# Patient Record
Sex: Female | Born: 1980 | Race: Black or African American | Hispanic: No | Marital: Married | Smoking: Never smoker
Health system: Southern US, Community
[De-identification: ages and names within clinical notes are randomized; demographics above are authoritative.]

## PROBLEM LIST (undated history)

## (undated) DIAGNOSIS — D649 Anemia, unspecified: Secondary | ICD-10-CM

## (undated) DIAGNOSIS — M1990 Unspecified osteoarthritis, unspecified site: Secondary | ICD-10-CM

## (undated) DIAGNOSIS — F32A Depression, unspecified: Secondary | ICD-10-CM

## (undated) DIAGNOSIS — K529 Noninfective gastroenteritis and colitis, unspecified: Secondary | ICD-10-CM

## (undated) DIAGNOSIS — F419 Anxiety disorder, unspecified: Secondary | ICD-10-CM

## (undated) DIAGNOSIS — G8929 Other chronic pain: Secondary | ICD-10-CM

## (undated) DIAGNOSIS — M549 Dorsalgia, unspecified: Secondary | ICD-10-CM

## (undated) DIAGNOSIS — R51 Headache: Secondary | ICD-10-CM

## (undated) DIAGNOSIS — F329 Major depressive disorder, single episode, unspecified: Secondary | ICD-10-CM

## (undated) HISTORY — DX: Dorsalgia, unspecified: M54.9

## (undated) HISTORY — PX: OTHER SURGICAL HISTORY: SHX169

## (undated) HISTORY — PX: COLONOSCOPY: SHX174

---
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
## 2009-03-13 ENCOUNTER — Emergency Department (HOSPITAL_COMMUNITY): Admission: EM | Admit: 2009-03-13 | Discharge: 2009-03-13 | Payer: Self-pay | Admitting: Emergency Medicine

## 2009-03-23 ENCOUNTER — Inpatient Hospital Stay (HOSPITAL_COMMUNITY): Admission: EM | Admit: 2009-03-23 | Discharge: 2009-03-27 | Payer: Self-pay | Admitting: Emergency Medicine

## 2009-03-24 ENCOUNTER — Ambulatory Visit: Payer: Self-pay | Admitting: Gastroenterology

## 2009-10-09 ENCOUNTER — Emergency Department (HOSPITAL_COMMUNITY): Admission: EM | Admit: 2009-10-09 | Discharge: 2009-10-09 | Payer: Self-pay | Admitting: Emergency Medicine

## 2010-05-10 ENCOUNTER — Emergency Department (HOSPITAL_COMMUNITY)
Admission: EM | Admit: 2010-05-10 | Discharge: 2010-05-10 | Disposition: A | Discharge status: Home / Self Care | Payer: Self-pay | Attending: Emergency Medicine | Admitting: Emergency Medicine

## 2010-05-10 ENCOUNTER — Emergency Department (HOSPITAL_COMMUNITY): Payer: Self-pay

## 2010-05-10 DIAGNOSIS — L03039 Cellulitis of unspecified toe: Secondary | ICD-10-CM | POA: Insufficient documentation

## 2010-05-10 DIAGNOSIS — G8929 Other chronic pain: Secondary | ICD-10-CM | POA: Insufficient documentation

## 2010-05-10 DIAGNOSIS — R109 Unspecified abdominal pain: Secondary | ICD-10-CM | POA: Insufficient documentation

## 2010-05-10 DIAGNOSIS — R000 Tachycardia, unspecified: Secondary | ICD-10-CM | POA: Insufficient documentation

## 2010-05-10 LAB — CBC
HCT: 39.5 % (ref 36.0–46.0)
HCT: 39.8 % (ref 36.0–46.0)
Hemoglobin: 13.4 g/dL (ref 12.0–15.0)
MCH: 30.5 pg (ref 26.0–34.0)
MCH: 28.8 pg (ref 26.0–34.0)
MCHC: 34.3 g/dL (ref 30.0–36.0)
MCHC: 33.7 g/dL (ref 30.0–36.0)
MCV: 88.8 fL (ref 78.0–100.0)
MCV: 85.4 fL (ref 78.0–100.0)
Platelets: 251 10*3/uL (ref 150–400)
Platelets: 248 10*3/uL (ref 150–400)
RBC: 4.66 MIL/uL (ref 3.87–5.11)
RDW: 14.2 % (ref 11.5–15.5)
RDW: 13.4 % (ref 11.5–15.5)
WBC: 11.4 10*3/uL — ABNORMAL HIGH (ref 4.0–10.5)
WBC: 13.1 10*3/uL — ABNORMAL HIGH (ref 4.0–10.5)

## 2010-05-10 LAB — DIFFERENTIAL
Basophils Absolute: 0.1 10*3/uL (ref 0.0–0.1)
Basophils Absolute: 0 K/uL (ref 0.0–0.1)
Basophils Relative: 1 % (ref 0–1)
Basophils Relative: 0 % (ref 0–1)
Eosinophils Absolute: 0.2 K/uL (ref 0.0–0.7)
Eosinophils Relative: 3 % (ref 0–5)
Eosinophils Relative: 1 % (ref 0–5)
Lymphocytes Relative: 22 % (ref 12–46)
Lymphs Abs: 2.8 10*3/uL (ref 0.7–4.0)
Monocytes Absolute: 1 10*3/uL (ref 0.1–1.0)
Monocytes Absolute: 1.1 10*3/uL — ABNORMAL HIGH (ref 0.1–1.0)
Monocytes Relative: 9 % (ref 3–12)
Neutro Abs: 9 10*3/uL — ABNORMAL HIGH (ref 1.7–7.7)
Neutrophils Relative %: 68 % (ref 43–77)

## 2010-05-10 LAB — COMPREHENSIVE METABOLIC PANEL WITH GFR
Albumin: 4.1 g/dL (ref 3.5–5.2)
Alkaline Phosphatase: 55 U/L (ref 39–117)
BUN: 7 mg/dL (ref 6–23)
Calcium: 8.8 mg/dL (ref 8.4–10.5)
GFR calc Af Amer: >60 mL/min (ref >60)
Potassium: 3.5 meq/L (ref 3.5–5.1)
Sodium: 136 meq/L (ref 135–145)
Total Protein: 7.2 g/dL (ref 6.0–8.3)

## 2010-05-10 LAB — URINALYSIS, ROUTINE W REFLEX MICROSCOPIC
Bilirubin Urine: NEGATIVE
Glucose, UA: NEGATIVE mg/dL
Ketones, ur: NEGATIVE mg/dL
Ketones, ur: NEGATIVE mg/dL
Leukocytes, UA: NEGATIVE
Nitrite: NEGATIVE
Protein, ur: NEGATIVE mg/dL
Protein, ur: NEGATIVE mg/dL
Specific Gravity, Urine: 1.025 (ref 1.005–1.030)
Urobilinogen, UA: 0.2 mg/dL (ref 0.0–1.0)
Urobilinogen, UA: 0.2 mg/dL (ref 0.0–1.0)
pH: 5.5 (ref 5.0–8.0)

## 2010-05-10 LAB — COMPREHENSIVE METABOLIC PANEL
ALT: 14 U/L (ref 0–35)
ALT: 17 U/L (ref 0–35)
AST: 20 U/L (ref 0–37)
Alkaline Phosphatase: 61 U/L (ref 39–117)
BUN: 11 mg/dL (ref 6–23)
CO2: 24 mEq/L (ref 19–32)
Chloride: 108 mEq/L (ref 96–112)
Creatinine, Ser: 0.93 mg/dL (ref 0.4–1.2)
Creatinine, Ser: 0.79 mg/dL (ref 0.4–1.2)
GFR calc Af Amer: >60 mL/min (ref >60)
GFR calc non Af Amer: >60 mL/min (ref >60)
GFR calc non Af Amer: >60 mL/min (ref >60)
Glucose, Bld: 86 mg/dL (ref 70–99)
Total Bilirubin: 0.1 mg/dL — ABNORMAL LOW (ref 0.3–1.2)
Total Bilirubin: 0.8 mg/dL (ref 0.3–1.2)
Total Protein: 6.8 g/dL (ref 6.0–8.3)

## 2010-05-10 LAB — URINE MICROSCOPIC-ADD ON

## 2010-05-10 LAB — LIPASE, BLOOD
Lipase: 30 U/L (ref 11–59)
Lipase: 20 U/L (ref 11–59)

## 2010-05-13 LAB — CLOSTRIDIUM DIFFICILE EIA
C difficile Toxins A+B, EIA: NEGATIVE
C difficile Toxins A+B, EIA: NEGATIVE

## 2010-05-13 LAB — DIFFERENTIAL
Basophils Relative: 1 % (ref 0–1)
Lymphocytes Relative: 18 % (ref 12–46)
Lymphs Abs: 2.5 10*3/uL (ref 0.7–4.0)
Monocytes Absolute: 0.9 10*3/uL (ref 0.1–1.0)
Monocytes Relative: 6 % (ref 3–12)
Neutro Abs: 10.5 10*3/uL — ABNORMAL HIGH (ref 1.7–7.7)
Neutrophils Relative %: 73 % (ref 43–77)

## 2010-05-13 LAB — CBC
HCT: 40.7 % (ref 36.0–46.0)
HCT: 40.8 % (ref 36.0–46.0)
Hemoglobin: 11.8 g/dL — ABNORMAL LOW (ref 12.0–15.0)
Hemoglobin: 13.3 g/dL (ref 12.0–15.0)
Hemoglobin: 13.4 g/dL (ref 12.0–15.0)
MCHC: 32.7 g/dL (ref 30.0–36.0)
MCHC: 33 g/dL (ref 30.0–36.0)
RDW: 13.6 % (ref 11.5–15.5)
RDW: 13.9 % (ref 11.5–15.5)
RDW: 14 % (ref 11.5–15.5)

## 2010-05-13 LAB — BASIC METABOLIC PANEL
BUN: 8 mg/dL (ref 6–23)
CO2: 25 mEq/L (ref 19–32)
Calcium: 8.9 mg/dL (ref 8.4–10.5)
Chloride: 106 mEq/L (ref 96–112)
Creatinine, Ser: 0.89 mg/dL (ref 0.4–1.2)
GFR calc Af Amer: >60 mL/min (ref >60)
GFR calc Af Amer: >60 mL/min (ref >60)
GFR calc non Af Amer: >60 mL/min (ref >60)
Glucose, Bld: 167 mg/dL — ABNORMAL HIGH (ref 70–99)
Glucose, Bld: 90 mg/dL (ref 70–99)
Potassium: 4.2 mEq/L (ref 3.5–5.1)
Sodium: 137 mEq/L (ref 135–145)
Sodium: 139 mEq/L (ref 135–145)

## 2010-05-13 LAB — URINALYSIS, ROUTINE W REFLEX MICROSCOPIC
Glucose, UA: NEGATIVE mg/dL
Protein, ur: NEGATIVE mg/dL
Specific Gravity, Urine: 1.026 (ref 1.005–1.030)
Urobilinogen, UA: 0.2 mg/dL (ref 0.0–1.0)

## 2010-05-13 LAB — POCT PREGNANCY, URINE: Preg Test, Ur: NEGATIVE

## 2010-05-13 LAB — COMPREHENSIVE METABOLIC PANEL
AST: 28 U/L (ref 0–37)
Albumin: 3.7 g/dL (ref 3.5–5.2)
Alkaline Phosphatase: 62 U/L (ref 39–117)
BUN: 11 mg/dL (ref 6–23)
Creatinine, Ser: 0.98 mg/dL (ref 0.4–1.2)
GFR calc non Af Amer: >60 mL/min (ref >60)
Potassium: 3.8 mEq/L (ref 3.5–5.1)
Total Bilirubin: 0.5 mg/dL (ref 0.3–1.2)

## 2010-05-13 LAB — LIPASE, BLOOD: Lipase: 20 U/L (ref 11–59)

## 2010-07-12 NOTE — Procedures (Signed)
[place]  Patient:    Crystal Hatfield                     MRN: [id number] Proc. Date: 03/13/99 Adm. Date:  [id number] Attending:  Juanita Craver CC:         Royetta Crochet. Karlton Lemon, M.D.                           Procedure Report  DATE OF BIRTH:  [birth year]  REFERRING PHYSICIAN:  Royetta Crochet. Karlton Lemon, M.D.  PROCEDURE PERFORMED:  Colonoscopy with biopsies.  ENDOSCOPIST:  Nelwyn Salisbury, M.D.  INSTRUMENT USED:  Olympus video colonoscope.  INDICATIONS FOR PROCEDURE:  Diarrhea with rectal bleeding in a 30 year old black female rule out inflammatory bowel disease.  PREPROCEDURE PREPARATION:  Informed consent was procured from the patient.  The  patient was fasted for eight hours prior to the procedure and prepped with a bottle of magnesium citrate and a gallon of NuLytely the night prior to the procedure.  PREPROCEDURE PHYSICAL:  The patient had stable vital signs.  Neck supple. Chest clear to auscultation.  S1, S2 regular.  Abdomen soft with normal abdominal bowel sounds.  DESCRIPTION OF PROCEDURE:  The patient was placed in the left lateral decubitus  position and sedated with 120 mg of Demerol and 12 mg of Versed intravenously.  Once the patient was adequately sedated and maintained on low-flow oxygen and continuous cardiac monitoring, the Olympus video colonoscope was advanced from he rectum to the cecum with slight difficulty.  The adult scope had to be withdrawn and the pediatric scope had to be used instead.  There was extreme friability of the mucosa throughout the colon with more prominent change in the left colon. Changes consistent with colitis were also seen in the cecum.  The terminal ileum appeared healthy.  The patient tolerated the procedure well.  Multiple biopsies  were done from the cecum and the colon for pathology.  Mucosal changes were also present in the rectum.  IMPRESSION:  Pancolitis.  Biopsies done,  results pending.  RECOMMENDATIONS: 1. Await pathology results. 2. Avoid all nonsteroidals for now. 3. Outpatient follow-up for further treatment recommendations. 4. Soft diet.DD:  03/13/99 TD:  03/13/99 Job: [id number]

## 2010-12-31 ENCOUNTER — Encounter: Payer: Self-pay | Admitting: Emergency Medicine

## 2010-12-31 ENCOUNTER — Emergency Department (HOSPITAL_COMMUNITY)
Admission: EM | Admit: 2010-12-31 | Discharge: 2011-01-01 | Disposition: A | Discharge status: Home / Self Care | Payer: Self-pay | Attending: Emergency Medicine | Admitting: Emergency Medicine

## 2010-12-31 DIAGNOSIS — Z8719 Personal history of other diseases of the digestive system: Secondary | ICD-10-CM | POA: Insufficient documentation

## 2010-12-31 DIAGNOSIS — R109 Unspecified abdominal pain: Secondary | ICD-10-CM | POA: Insufficient documentation

## 2010-12-31 HISTORY — DX: Noninfective gastroenteritis and colitis, unspecified: K52.9

## 2010-12-31 LAB — POCT PREGNANCY, URINE: Preg Test, Ur: NEGATIVE

## 2010-12-31 LAB — URINALYSIS, DIPSTICK ONLY
Glucose, UA: NEGATIVE mg/dL
Ketones, ur: NEGATIVE mg/dL
Leukocytes, UA: NEGATIVE
Nitrite: NEGATIVE
Specific Gravity, Urine: 1.017 (ref 1.005–1.030)
pH: 7.5 (ref 5.0–8.0)

## 2010-12-31 NOTE — ED Notes (Signed)
Lt side abd pain for a long time unsure of the time, has been seen multiple times over the past years and nothing has been found has r/o several things had ultrasound and colostomy done with no results

## 2010-12-31 NOTE — ED Notes (Signed)
Report received from [facility]

## 2011-01-01 LAB — COMPREHENSIVE METABOLIC PANEL
ALT: 18 U/L (ref 0–35)
AST: 19 U/L (ref 0–37)
Alkaline Phosphatase: 70 U/L (ref 39–117)
CO2: 24 mEq/L (ref 19–32)
Chloride: 103 mEq/L (ref 96–112)
GFR calc Af Amer: >90 mL/min (ref >90)
GFR calc non Af Amer: >90 mL/min (ref >90)
Glucose, Bld: 87 mg/dL (ref 70–99)
Potassium: 3.7 mEq/L (ref 3.5–5.1)
Sodium: 136 mEq/L (ref 135–145)
Total Bilirubin: 0.3 mg/dL (ref 0.3–1.2)

## 2011-01-01 LAB — CBC
Hemoglobin: 14 g/dL (ref 12.0–15.0)
MCH: 29.9 pg (ref 26.0–34.0)
Platelets: 281 10*3/uL (ref 150–400)
RBC: 4.68 MIL/uL (ref 3.87–5.11)
WBC: 15 10*3/uL — ABNORMAL HIGH (ref 4.0–10.5)

## 2011-01-01 MED ORDER — FAMOTIDINE 20 MG PO TABS
20.0000 mg | ORAL_TABLET | Freq: Once | ORAL | Status: AC
  Administered 2011-01-01: 20 mg via ORAL
  Filled 2011-01-01: qty 1

## 2011-01-01 MED ORDER — PANTOPRAZOLE SODIUM 40 MG PO TBEC: 40.0000 mg | DELAYED_RELEASE_TABLET | Freq: Every day | ORAL | Status: DC

## 2011-01-01 MED ORDER — DICYCLOMINE HCL 20 MG PO TABS: 20.0000 mg | ORAL_TABLET | Freq: Four times a day (QID) | ORAL | Status: DC | PRN

## 2011-01-01 MED ORDER — GI COCKTAIL ~~LOC~~
30.0000 mL | Freq: Once | ORAL | Status: AC
  Administered 2011-01-01: 30 mL via ORAL
  Filled 2011-01-01: qty 30

## 2011-01-01 MED ORDER — HYDROMORPHONE HCL PF 1 MG/ML IJ SOLN
1.0000 mg | Freq: Once | INTRAMUSCULAR | Status: AC
  Administered 2011-01-01: 1 mg via INTRAMUSCULAR
  Filled 2011-01-01: qty 1

## 2011-01-01 MED ORDER — HYDROCODONE-ACETAMINOPHEN 5-325 MG PO TABS
2.0000 | ORAL_TABLET | Freq: Once | ORAL | Status: AC
  Administered 2011-01-01: 2 via ORAL
  Filled 2011-01-01: qty 2

## 2011-01-01 NOTE — ED Notes (Signed)
Pt. Resting comfortably.

## 2011-01-01 NOTE — ED Provider Notes (Signed)
History     CSN: [id number] Arrival date & time: 12/31/2010  5:56 PM   First MD Initiated Contact with Patient 12/31/10 2354      Chief Complaint  Patient presents with  . Abdominal Pain    (Consider location/radiation/quality/duration/timing/severity/associated sxs/prior treatment) Patient is a 30 y.o. female presenting with abdominal pain. The history is provided by the patient.  Abdominal Pain The primary symptoms of the illness include abdominal pain. The primary symptoms of the illness do not include fever, shortness of breath, vomiting, diarrhea or dysuria. The problem has not changed since onset. Symptoms associated with the illness do not include back pain.  pt notes hx ulcerative colitis. States has had constant epigastric and left upper abd pain for past 1-2 years. No acute or abupt change in pain. States has had cts to eval in past, neg for acute process, u/s neg, and recent gi eval incl colonoscopy neg for acute uc flare. Saw gi doctor today - says cause constant prolonged pain unknown so sent to ed. w pain, no exacerbating or allev factors. No change w eating. Normal appetite. No wt change. No fever or chills. No prior abd surgery. Denies diarrhea or constipation. No vomiting. No cough or uri c/o. No cp. No vagina ldischarge or bleeding. No gu c/o. Denies hx pud, gallstones, pancreatitis, celiac dis, or ibs.   Past Medical History  Diagnosis Date  . Colitis     No past surgical history on file.  No family history on file.  History  Substance Use Topics  . Smoking status: Not on file  . Smokeless tobacco: Not on file  . Alcohol Use: Yes    OB History    Grav Para Term Preterm Abortions TAB SAB Ect Mult Living                  Review of Systems  Constitutional: Negative for fever.  HENT: Negative for neck pain.   Eyes: Negative for redness.  Respiratory: Negative for shortness of breath.   Cardiovascular: Negative for chest pain.  Gastrointestinal: Positive  for abdominal pain. Negative for vomiting and diarrhea.  Genitourinary: Negative for dysuria.  Musculoskeletal: Negative for back pain.  Skin: Negative for rash.  Neurological: Negative for headaches.  Hematological: Does not bruise/bleed easily.  Psychiatric/Behavioral: Negative for confusion.    Allergies  Sulfa antibiotics  Home Medications   Current Outpatient Rx  Name Route Sig Dispense Refill  . RISAQUAD PO CAPS Oral Take 1 capsule by mouth daily.      Marland Kitchen LORATADINE 10 MG PO TABS Oral Take 10 mg by mouth daily.      Marland Kitchen MESALAMINE 1.2 G PO TBEC Oral Take 1,200 mg by mouth 2 (two) times daily.      . OXYCODONE-ACETAMINOPHEN 5-325 MG PO TABS Oral Take 1 tablet by mouth every 4 (four) hours as needed. pain       BP 137/73  Pulse 98  Temp(Src) 98.3 F (36.8 C) (Oral)  Resp 20  SpO2 99%  Physical Exam  Nursing note and vitals reviewed. Constitutional: She appears well-developed and well-nourished. No distress.  Eyes: Conjunctivae are normal. No scleral icterus.  Neck: Neck supple. No tracheal deviation present.  Cardiovascular: Normal rate, regular rhythm and normal heart sounds.  Exam reveals no gallop and no friction rub.   No murmur heard. Pulmonary/Chest: Effort normal and breath sounds normal. No respiratory distress. She has no wheezes. She has no rales. She exhibits no tenderness.  Abdominal: Soft. Normal appearance  and bowel sounds are normal. She exhibits no distension and no mass. There is no tenderness. There is no rebound and no guarding.       Obese. Nt. No hernia.   Genitourinary:       No cva tendernes  Musculoskeletal: She exhibits no edema.  Neurological: She is alert.  Skin: Skin is warm and dry. No rash noted.  Psychiatric: She has a normal mood and affect.    ED Course  Procedures (including critical care time)  Labs Reviewed  URINALYSIS, DIPSTICK ONLY - Abnormal; Notable for the following:    Hgb urine dipstick TRACE (*)    All other components  within normal limits  POCT PREGNANCY, URINE  POCT PREGNANCY, URINE   No results found.   No diagnosis found.  Results for orders placed during the hospital encounter of 12/31/10  URINALYSIS, DIPSTICK ONLY      Component Value Range   Specific Gravity, Urine 1.017  1.005 - 1.030    pH 7.5  5.0 - 8.0    Glucose, UA NEGATIVE  NEGATIVE (mg/dL)   Hgb urine dipstick TRACE (*) NEGATIVE    Bilirubin Urine NEGATIVE  NEGATIVE    Ketones, ur NEGATIVE  NEGATIVE (mg/dL)   Protein, ur NEGATIVE  NEGATIVE (mg/dL)   Urobilinogen, UA 0.2  0.0 - 1.0 (mg/dL)   Nitrite NEGATIVE  NEGATIVE    Leukocytes, UA NEGATIVE  NEGATIVE   POCT PREGNANCY, URINE      Component Value Range   Preg Test, Ur NEGATIVE    CBC      Component Value Range   WBC 15.0 (*) 4.0 - 10.5 (K/uL)   RBC 4.68  3.87 - 5.11 (MIL/uL)   Hemoglobin 14.0  12.0 - 15.0 (g/dL)   HCT 41.9  36.0 - 46.0 (%)   MCV 89.5  78.0 - 100.0 (fL)   MCH 29.9  26.0 - 34.0 (pg)   MCHC 33.4  30.0 - 36.0 (g/dL)   RDW 13.2  11.5 - 15.5 (%)   Platelets 281  150 - 400 (K/uL)  COMPREHENSIVE METABOLIC PANEL      Component Value Range   Sodium 136  135 - 145 (mEq/L)   Potassium 3.7  3.5 - 5.1 (mEq/L)   Chloride 103  96 - 112 (mEq/L)   CO2 24  19 - 32 (mEq/L)   Glucose, Bld 87  70 - 99 (mg/dL)   BUN 11  6 - 23 (mg/dL)   Creatinine, Ser 0.77  0.50 - 1.10 (mg/dL)   Calcium 9.4  8.4 - 10.5 (mg/dL)   Total Protein 7.1  6.0 - 8.3 (g/dL)   Albumin 3.9  3.5 - 5.2 (g/dL)   AST 19  0 - 37 (U/L)   ALT 18  0 - 35 (U/L)   Alkaline Phosphatase 70  39 - 117 (U/L)   Total Bilirubin 0.3  0.3 - 1.2 (mg/dL)   GFR calc non Af Amer >90  >90 (mL/min)   GFR calc Af Amer >90  >90 (mL/min)  LIPASE, BLOOD      Component Value Range   Lipase 20  11 - 59 (U/L)   No results found.    MDM  Labs. Reviewed prior ct and u/s - negative. pepcid and gi cocktail po. vicodin po. Initially denies pain relief. abd soft nt. Dilaudid 1 mg im.   Pt reports pain for >1 yr without  acute change. No fever. abd soft nt. No nv. Discussed w pt close pcp and  gi follow up.   Pt had just taken course of pred.   No fever or chills. abd soft nt.     Mirna Mires, MD 01/01/11 [phone]

## 2011-01-02 ENCOUNTER — Encounter (HOSPITAL_COMMUNITY): Payer: Self-pay

## 2011-01-02 ENCOUNTER — Ambulatory Visit (HOSPITAL_COMMUNITY)
Admission: RE | Admit: 2011-01-02 | Discharge: 2011-01-02 | Disposition: A | Discharge status: Home / Self Care | Payer: Self-pay | Source: Ambulatory Visit | Attending: Gastroenterology | Admitting: Gastroenterology

## 2011-01-02 ENCOUNTER — Other Ambulatory Visit (HOSPITAL_COMMUNITY): Payer: Self-pay | Admitting: Gastroenterology

## 2011-01-02 DIAGNOSIS — R319 Hematuria, unspecified: Secondary | ICD-10-CM

## 2011-01-02 DIAGNOSIS — R109 Unspecified abdominal pain: Secondary | ICD-10-CM | POA: Insufficient documentation

## 2011-01-02 DIAGNOSIS — R1032 Left lower quadrant pain: Secondary | ICD-10-CM

## 2011-01-02 DIAGNOSIS — N83209 Unspecified ovarian cyst, unspecified side: Secondary | ICD-10-CM | POA: Insufficient documentation

## 2011-01-02 MED ORDER — IOHEXOL 300 MG/ML  SOLN
90.0000 mL | Freq: Once | INTRAMUSCULAR | Status: AC | PRN
  Administered 2011-01-02: 90 mL via INTRAVENOUS

## 2011-03-30 ENCOUNTER — Encounter (HOSPITAL_COMMUNITY): Payer: Self-pay | Admitting: Pharmacist

## 2011-04-07 ENCOUNTER — Encounter (HOSPITAL_COMMUNITY)
Admission: RE | Admit: 2011-04-07 | Discharge: 2011-04-07 | Disposition: A | Discharge status: Home / Self Care | Payer: Self-pay | Source: Ambulatory Visit | Attending: Obstetrics and Gynecology | Admitting: Obstetrics and Gynecology

## 2011-04-07 ENCOUNTER — Encounter (HOSPITAL_COMMUNITY): Payer: Self-pay

## 2011-04-07 HISTORY — DX: Dorsalgia, unspecified: M54.9

## 2011-04-07 HISTORY — DX: Anxiety disorder, unspecified: F41.9

## 2011-04-07 HISTORY — DX: Unspecified osteoarthritis, unspecified site: M19.90

## 2011-04-07 HISTORY — DX: Headache: R51

## 2011-04-07 HISTORY — DX: Anemia, unspecified: D64.9

## 2011-04-07 HISTORY — DX: Depression, unspecified: F32.A

## 2011-04-07 HISTORY — DX: Other chronic pain: G89.29

## 2011-04-07 HISTORY — DX: Major depressive disorder, single episode, unspecified: F32.9

## 2011-04-07 LAB — CBC
HCT: 40.7 % (ref 36.0–46.0)
Hemoglobin: 13.3 g/dL (ref 12.0–15.0)
MCHC: 32.7 g/dL (ref 30.0–36.0)
MCV: 88.7 fL (ref 78.0–100.0)
RDW: 13.4 % (ref 11.5–15.5)

## 2011-04-07 LAB — SURGICAL PCR SCREEN: Staphylococcus aureus: POSITIVE — AB

## 2011-04-07 NOTE — Patient Instructions (Addendum)
   Your procedure is scheduled on: Friday, Feb 15th  Enter through the [employer] [facility] at: [employer] up the phone at the desk and dial [phone] and inform us of your arrival.  Please call this number if you have any problems the morning of surgery: [phone]  Remember: Do not eat food after midnight: Thursday Do not drink clear liquids after:  Thursday Take these medicines the morning of surgery with a SIP OF WATER: Dilaudid and LIALDA  Do not wear jewelry, make-up, or FINGER nail polish Do not wear lotions, powders, perfumes or deodorant. Do not shave 48 hours prior to surgery. Do not bring valuables to the hospital.   Patients discharged on the day of surgery will not be allowed to drive home.  Home with Mother Gavina Dildine or partner Mateo Flow.   Remember to use your hibiclens as instructed.Please shower with 1/2 bottle the evening before your surgery and the other 1/2 bottle the morning of surgery.

## 2011-04-07 NOTE — Pre-Procedure Instructions (Signed)
OK to see patient DOS

## 2011-04-10 NOTE — H&P (Signed)
31 year old with LLQ pain x 1 year. She has a history of ulcerative colitis and is followed by Dr. Collene Mares.  CT Scan in November unremarkable. Pain unrelieved by birth control pills. Med History Above  Surgical history Neg  Meds See list  Allergies Sulfa  ROS Negative  Afebrile VSS Gen alert and oriented  lung CTAB Car RRR Abd sl tender in LLQ Pelvic is significant for LLQ Tendrness  Impression Chronic LLQ Pain  Plan Diagnostic laparoscopy Risks reviewed with the ptient

## 2011-04-11 ENCOUNTER — Ambulatory Visit (HOSPITAL_COMMUNITY): Payer: Self-pay | Admitting: Anesthesiology

## 2011-04-11 ENCOUNTER — Encounter (HOSPITAL_COMMUNITY): Payer: Self-pay | Admitting: Anesthesiology

## 2011-04-11 ENCOUNTER — Encounter (HOSPITAL_COMMUNITY): Payer: Self-pay | Admitting: *Deleted

## 2011-04-11 ENCOUNTER — Ambulatory Visit (HOSPITAL_COMMUNITY)
Admission: RE | Admit: 2011-04-11 | Discharge: 2011-04-11 | Disposition: A | Discharge status: Home / Self Care | Payer: Self-pay | Source: Ambulatory Visit | Attending: Obstetrics and Gynecology | Admitting: Obstetrics and Gynecology

## 2011-04-11 ENCOUNTER — Encounter (HOSPITAL_COMMUNITY)
Admission: RE | Disposition: A | Discharge status: Home / Self Care | Payer: Self-pay | Source: Ambulatory Visit | Attending: Obstetrics and Gynecology

## 2011-04-11 DIAGNOSIS — R102 Pelvic and perineal pain: Secondary | ICD-10-CM

## 2011-04-11 DIAGNOSIS — N949 Unspecified condition associated with female genital organs and menstrual cycle: Secondary | ICD-10-CM | POA: Insufficient documentation

## 2011-04-11 DIAGNOSIS — K5190 Ulcerative colitis, unspecified, without complications: Secondary | ICD-10-CM | POA: Insufficient documentation

## 2011-04-11 DIAGNOSIS — R1032 Left lower quadrant pain: Secondary | ICD-10-CM | POA: Insufficient documentation

## 2011-04-11 HISTORY — PX: LAPAROSCOPY: SHX197

## 2011-04-11 LAB — PREGNANCY, URINE: Preg Test, Ur: NEGATIVE

## 2011-04-11 SURGERY — LAPAROSCOPY OPERATIVE
Anesthesia: General | Site: Abdomen | Wound class: Clean

## 2011-04-11 MED ORDER — BUPIVACAINE HCL (PF) 0.25 % IJ SOLN
INTRAMUSCULAR | Status: AC
  Filled 2011-04-11: qty 30

## 2011-04-11 MED ORDER — HYDROMORPHONE HCL PF 1 MG/ML IJ SOLN
INTRAMUSCULAR | Status: AC
  Filled 2011-04-11: qty 1

## 2011-04-11 MED ORDER — SODIUM CHLORIDE 0.9 % IJ SOLN
INTRAMUSCULAR | Status: DC | PRN
  Administered 2011-04-11: 5 mL

## 2011-04-11 MED ORDER — GLYCOPYRROLATE 0.2 MG/ML IJ SOLN
INTRAMUSCULAR | Status: AC
  Filled 2011-04-11: qty 1

## 2011-04-11 MED ORDER — ROCURONIUM BROMIDE 100 MG/10ML IV SOLN
INTRAVENOUS | Status: DC | PRN
  Administered 2011-04-11: 5 mg via INTRAVENOUS
  Administered 2011-04-11: 35 mg via INTRAVENOUS

## 2011-04-11 MED ORDER — KETOROLAC TROMETHAMINE 30 MG/ML IJ SOLN: 15.0000 mg | Freq: Once | INTRAMUSCULAR | Status: DC | PRN

## 2011-04-11 MED ORDER — BUPIVACAINE-EPINEPHRINE (PF) 0.5% -1:200000 IJ SOLN
INTRAMUSCULAR | Status: AC
  Filled 2011-04-11: qty 10

## 2011-04-11 MED ORDER — GLYCOPYRROLATE 0.2 MG/ML IJ SOLN
INTRAMUSCULAR | Status: DC | PRN
  Administered 2011-04-11: 1 mg via INTRAVENOUS

## 2011-04-11 MED ORDER — ROCURONIUM BROMIDE 50 MG/5ML IV SOLN
INTRAVENOUS | Status: AC
  Filled 2011-04-11: qty 1

## 2011-04-11 MED ORDER — LIDOCAINE HCL (CARDIAC) 20 MG/ML IV SOLN
INTRAVENOUS | Status: AC
  Filled 2011-04-11: qty 5

## 2011-04-11 MED ORDER — BUPIVACAINE HCL (PF) 0.25 % IJ SOLN
INTRAMUSCULAR | Status: DC | PRN
  Administered 2011-04-11: 5 mL

## 2011-04-11 MED ORDER — ONDANSETRON HCL 4 MG/2ML IJ SOLN
INTRAMUSCULAR | Status: DC | PRN
  Administered 2011-04-11: 4 mg via INTRAVENOUS

## 2011-04-11 MED ORDER — LACTATED RINGERS IV SOLN
INTRAVENOUS | Status: DC
  Administered 2011-04-11: 07:00:00 via INTRAVENOUS

## 2011-04-11 MED ORDER — LIDOCAINE HCL (CARDIAC) 20 MG/ML IV SOLN
INTRAVENOUS | Status: DC | PRN
  Administered 2011-04-11: 60 mg via INTRAVENOUS

## 2011-04-11 MED ORDER — ONDANSETRON HCL 4 MG/2ML IJ SOLN
INTRAMUSCULAR | Status: AC
  Filled 2011-04-11: qty 2

## 2011-04-11 MED ORDER — FENTANYL CITRATE 0.05 MG/ML IJ SOLN
INTRAMUSCULAR | Status: DC | PRN
  Administered 2011-04-11: 100 ug via INTRAVENOUS
  Administered 2011-04-11: 150 ug via INTRAVENOUS

## 2011-04-11 MED ORDER — HYDROMORPHONE HCL 2 MG PO TABS
ORAL_TABLET | ORAL | Status: AC
  Filled 2011-04-11: qty 2

## 2011-04-11 MED ORDER — MIDAZOLAM HCL 5 MG/5ML IJ SOLN
INTRAMUSCULAR | Status: DC | PRN
  Administered 2011-04-11: 2 mg via INTRAVENOUS

## 2011-04-11 MED ORDER — DEXAMETHASONE SODIUM PHOSPHATE 10 MG/ML IJ SOLN
INTRAMUSCULAR | Status: AC
  Filled 2011-04-11: qty 1

## 2011-04-11 MED ORDER — PROPOFOL 10 MG/ML IV EMUL
INTRAVENOUS | Status: AC
  Filled 2011-04-11: qty 20

## 2011-04-11 MED ORDER — HYDROMORPHONE HCL PF 1 MG/ML IJ SOLN
INTRAMUSCULAR | Status: DC | PRN
  Administered 2011-04-11 (×2): 1 mg via INTRAVENOUS

## 2011-04-11 MED ORDER — KETOROLAC TROMETHAMINE 30 MG/ML IJ SOLN
INTRAMUSCULAR | Status: DC | PRN
  Administered 2011-04-11: 30 mg via INTRAVENOUS

## 2011-04-11 MED ORDER — DEXAMETHASONE SODIUM PHOSPHATE 4 MG/ML IJ SOLN
INTRAMUSCULAR | Status: DC | PRN
  Administered 2011-04-11: 10 mg via INTRAVENOUS

## 2011-04-11 MED ORDER — CEFAZOLIN SODIUM 1-5 GM-% IV SOLN
INTRAVENOUS | Status: AC
  Filled 2011-04-11: qty 50

## 2011-04-11 MED ORDER — HYDROMORPHONE HCL 2 MG PO TABS
4.0000 mg | ORAL_TABLET | Freq: Once | ORAL | Status: AC
  Administered 2011-04-11: 4 mg via ORAL

## 2011-04-11 MED ORDER — KETOROLAC TROMETHAMINE 60 MG/2ML IM SOLN
INTRAMUSCULAR | Status: AC
  Filled 2011-04-11: qty 2

## 2011-04-11 MED ORDER — PROPOFOL 10 MG/ML IV EMUL
INTRAVENOUS | Status: DC | PRN
  Administered 2011-04-11: 20 mg via INTRAVENOUS
  Administered 2011-04-11: 180 mg via INTRAVENOUS

## 2011-04-11 MED ORDER — FENTANYL CITRATE 0.05 MG/ML IJ SOLN
INTRAMUSCULAR | Status: AC
  Filled 2011-04-11: qty 5

## 2011-04-11 MED ORDER — CEFAZOLIN SODIUM 1-5 GM-% IV SOLN
1.0000 g | INTRAVENOUS | Status: AC
  Administered 2011-04-11: 1 g via INTRAVENOUS

## 2011-04-11 MED ORDER — KETOROLAC TROMETHAMINE 60 MG/2ML IM SOLN
INTRAMUSCULAR | Status: DC | PRN
  Administered 2011-04-11: 30 mg via INTRAMUSCULAR

## 2011-04-11 MED ORDER — FENTANYL CITRATE 0.05 MG/ML IJ SOLN
INTRAMUSCULAR | Status: AC
  Administered 2011-04-11: 50 ug via INTRAVENOUS
  Filled 2011-04-11: qty 2

## 2011-04-11 MED ORDER — MIDAZOLAM HCL 2 MG/2ML IJ SOLN
INTRAMUSCULAR | Status: AC
  Filled 2011-04-11: qty 2

## 2011-04-11 MED ORDER — NEOSTIGMINE METHYLSULFATE 1 MG/ML IJ SOLN
INTRAMUSCULAR | Status: AC
  Filled 2011-04-11: qty 10

## 2011-04-11 MED ORDER — FENTANYL CITRATE 0.05 MG/ML IJ SOLN
25.0000 ug | INTRAMUSCULAR | Status: DC | PRN
  Administered 2011-04-11 (×2): 50 ug via INTRAVENOUS

## 2011-04-11 MED ORDER — NEOSTIGMINE METHYLSULFATE 1 MG/ML IJ SOLN
INTRAMUSCULAR | Status: DC | PRN
  Administered 2011-04-11: 5 mg via INTRAVENOUS

## 2011-04-11 SURGICAL SUPPLY — 31 items
CABLE HIGH FREQUENCY MONO STRZ (ELECTRODE) IMPLANT
CATH ROBINSON RED A/P 16FR (CATHETERS) ×2 IMPLANT
CHLORAPREP W/TINT 26ML (MISCELLANEOUS) ×2 IMPLANT
CLOTH BEACON ORANGE TIMEOUT ST (SAFETY) ×2 IMPLANT
DECANTER SPIKE VIAL GLASS SM (MISCELLANEOUS) ×2 IMPLANT
DERMABOND ADVANCED (GAUZE/BANDAGES/DRESSINGS) ×1
DERMABOND ADVANCED .7 DNX12 (GAUZE/BANDAGES/DRESSINGS) ×1 IMPLANT
EVACUATOR SMOKE 8.L (FILTER) IMPLANT
GLOVE BIO SURGEON STRL SZ 6.5 (GLOVE) ×4 IMPLANT
GLOVE BIOGEL PI IND STRL 7.0 (GLOVE) ×1 IMPLANT
GLOVE BIOGEL PI INDICATOR 7.0 (GLOVE) ×1
GLOVE SS BIOGEL STRL SZ 7 (GLOVE) ×1 IMPLANT
GLOVE SUPERSENSE BIOGEL SZ 7 (GLOVE) ×1
GOWN PREVENTION PLUS LG XLONG (DISPOSABLE) ×4 IMPLANT
GOWN SURGICAL XLG (GOWNS) ×2 IMPLANT
NEEDLE INSUFFLATION 14GA 150MM (NEEDLE) ×2 IMPLANT
NS IRRIG 1000ML POUR BTL (IV SOLUTION) ×2 IMPLANT
PACK LAPAROSCOPY BASIN (CUSTOM PROCEDURE TRAY) ×2 IMPLANT
POUCH SPECIMEN RETRIEVAL 10MM (ENDOMECHANICALS) IMPLANT
PROTECTOR NERVE ULNAR (MISCELLANEOUS) ×2 IMPLANT
SEALER TISSUE G2 CVD JAW 35 (ENDOMECHANICALS) IMPLANT
SEALER TISSUE G2 CVD JAW 45CM (ENDOMECHANICALS) IMPLANT
SET IRRIG TUBING LAPAROSCOPIC (IRRIGATION / IRRIGATOR) IMPLANT
SUT VIC AB 3-0 PS2 18 (SUTURE) ×1
SUT VIC AB 3-0 PS2 18XBRD (SUTURE) ×1 IMPLANT
SUT VICRYL 0 UR6 27IN ABS (SUTURE) ×2 IMPLANT
TOWEL OR 17X24 6PK STRL BLUE (TOWEL DISPOSABLE) ×4 IMPLANT
TROCAR Z-THREAD BLADED 11X100M (TROCAR) ×2 IMPLANT
TROCAR Z-THREAD BLADED 5X100MM (TROCAR) IMPLANT
WARMER LAPAROSCOPE (MISCELLANEOUS) ×2 IMPLANT
WATER STERILE IRR 1000ML POUR (IV SOLUTION) IMPLANT

## 2011-04-11 NOTE — Brief Op Note (Signed)
04/11/2011  7:54 AM  PATIENT:  Crystal Hatfield  31 y.o. female  PRE-OPERATIVE DIAGNOSIS:  pelvic pain  POST-OPERATIVE DIAGNOSIS:  pelvic pain, adhesions involving the sigmoid colon to the pelvis  PROCEDURE:  Procedure(s) (LRB): LAPAROSCOPY OPERATIVE (N/A)  SURGEON:  Surgeon(s) and Role:    * Cyril Mourning, MD - Primary  PHYSICIAN ASSISTANT:   ASSISTANTS: none   ANESTHESIA:   general  EBL:  Total I/O In: 200 [I.V.:200] Out: -   BLOOD ADMINISTERED:none  DRAINS: none   LOCAL MEDICATIONS USED:  LIDOCAINE   SPECIMEN:  No Specimen  DISPOSITION OF SPECIMEN:  N/A  COUNTS:  YES  TOURNIQUET:  * No tourniquets in log *  DICTATION: .Other Dictation: Dictation Number [id number]  PLAN OF CARE: Discharge to home after PACU  PATIENT DISPOSITION:  PACU - hemodynamically stable.   Delay start of Pharmacological VTE agent (>24hrs) due to surgical blood loss or risk of bleeding: yes

## 2011-04-11 NOTE — Transfer of Care (Signed)
Immediate Anesthesia Transfer of Care Note  Patient: Crystal Hatfield  Procedure(s) Performed: Procedure(s) (LRB): LAPAROSCOPY OPERATIVE (N/A)  Patient Location: PACU  Anesthesia Type: General  Level of Consciousness: awake and oriented  Airway & Oxygen Therapy: Patient Spontanous Breathing and Patient connected to nasal cannula oxygen  Post-op Assessment: Report given to PACU RN and Post -op Vital signs reviewed and stable  Post vital signs: Reviewed and stable  Complications: No apparent anesthesia complications

## 2011-04-11 NOTE — Anesthesia Procedure Notes (Signed)
Procedure Name: Intubation Date/Time: 04/11/2011 7:21 AM Performed by: Kerby Nora Pre-anesthesia Checklist: Patient identified, Emergency Drugs available, Suction available, Patient being monitored and Timeout performed Patient Re-evaluated:Patient Re-evaluated prior to inductionOxygen Delivery Method: Circle System Utilized Preoxygenation: Pre-oxygenation with 100% oxygen Intubation Type: IV induction Ventilation: Mask ventilation without difficulty Laryngoscope Size: Mac and 3 Grade View: Grade I Tube type: Oral Tube size: 7.0 mm Number of attempts: 1 Airway Equipment and Method: stylet Placement Confirmation: ETT inserted through vocal cords under direct vision,  positive ETCO2 and breath sounds checked- equal and bilateral Secured at: 21 cm Tube secured with: Tape Dental Injury: Teeth and Oropharynx as per pre-operative assessment

## 2011-04-11 NOTE — Anesthesia Preprocedure Evaluation (Signed)
Anesthesia Evaluation  Patient identified by MRN, date of birth, ID band Patient awake    Reviewed: Allergy & Precautions, H&P , NPO status , Patient's Chart, lab work & pertinent test results, reviewed documented beta blocker date and time   History of Anesthesia Complications Negative for: history of anesthetic complications  Airway Mallampati: I TM Distance: >3 FB Neck ROM: full    Dental  (+) Teeth Intact   Pulmonary neg pulmonary ROS,  clear to auscultation  Pulmonary exam normal       Cardiovascular Exercise Tolerance: Good neg cardio ROS regular Normal    Neuro/Psych  Headaches, PSYCHIATRIC DISORDERS (depression/anxiety) Chronic back pain    GI/Hepatic GERD-  ,(+)     substance abuse  marijuana use, Ulcerative colitis   Endo/Other  Morbid obesity  Renal/GU negative Renal ROS  Genitourinary negative   Musculoskeletal   Abdominal   Peds  Hematology negative hematology ROS (+)   Anesthesia Other Findings   Reproductive/Obstetrics negative OB ROS                           Anesthesia Physical Anesthesia Plan  ASA: II  Anesthesia Plan: General ETT   Post-op Pain Management:    Induction:   Airway Management Planned:   Additional Equipment:   Intra-op Plan:   Post-operative Plan:   Informed Consent: I have reviewed the patients History and Physical, chart, labs and discussed the procedure including the risks, benefits and alternatives for the proposed anesthesia with the patient or authorized representative who has indicated his/her understanding and acceptance.   Dental Advisory Given  Plan Discussed with: CRNA and Surgeon  Anesthesia Plan Comments:         Anesthesia Quick Evaluation

## 2011-04-11 NOTE — Op Note (Signed)
Crystal Hatfield, Crystal Hatfield           ACCOUNT NO.:  [id number]  MEDICAL RECORD NO.:  [id number]  LOCATION:  [facility]                          FACILITY:  [facility]  PHYSICIAN:  Maiyah Goyne L. Keiasia Christianson, M.D.DATE OF BIRTH:  02/05/1981  DATE OF PROCEDURE:  04/11/2011 DATE OF DISCHARGE:  04/11/2011                              OPERATIVE REPORT   PREOPERATIVE DIAGNOSES:  Pelvic pain and ulcerative colitis.  POSTOPERATIVE DIAGNOSES:  Pelvic pain, ulcerative colitis, and adhesions involving the sigmoid colon.  SURGEON:  Olamae Ferrara L. Helane Rima, MD  ANESTHESIA:  General.  ESTIMATED BLOOD LOSS:  Minimal.  COMPLICATIONS:  None.  PROCEDURE:  The patient was taken to the operating room.  She was intubated.  She was prepped and draped in usual sterile fashion.  In-and- out catheter was used to empty the bladder.  The speculum was inserted into the vagina.  The uterine manipulator was inserted.  Attention was turned to the abdomen where a small infraumbilical incision was made. The Veress needle was inserted.  Pneumoperitoneum was performed.  An 11- mm trocar was then inserted.  The laparoscope was introduced through the trocar shape.  A 5-mm trocar was then placed suprapubically under direct visualization.  Exam of the upper abdomen revealed structures appeared normal.  Lower abdomen, uterus was normal size and configuration.  The bladder flap appeared normal.  No endometriotic lesions were noted.  The bowel surfaces appeared normal.  However, on the patient's left side which was in the area of where the patient's pain is.  There were some adhesions involving the sigmoid colon to the cul-de-sac on the left. This appeared very dense and the area did appear to be thickened.  I did not see any type of adhesion or involving endometriosis.  The ovary appeared normal.  The fallopian tube appeared normal.  The right fallopian tube and ovary appeared normal as well.  The most likely cause of this patient's pain is  secondary to her ulcerative colitis.  I think at this point, I need to refer her on.  She has a GI doctor, but I would like to send her to a colorectal surgeon to see if she possibly might need to have some type of resection of her sigmoid.  We will forward operative note as well as photographs to a colorectal surgeon.  The trocars were removed.  The incisions were closed with 3-0 Vicryl. Dermabond was applied.  The patient was extubated and went to recovery room in stable condition.     Jannelly Bergren L. Helane Rima, M.D.    Nevin Bloodgood  D:  04/11/2011  T:  04/11/2011  Job:  [id number]

## 2011-04-11 NOTE — Progress Notes (Signed)
History and Physical on the chart. No significant changes Will proceed with Diagnostic Laparoscopy  Patient already consented.

## 2011-04-11 NOTE — Anesthesia Postprocedure Evaluation (Signed)
  Anesthesia Post-op Note  Patient: Crystal Hatfield  Procedure(s) Performed: Procedure(s) (LRB): LAPAROSCOPY OPERATIVE (N/A)  Patient Location: PACU  Anesthesia Type: General  Level of Consciousness: awake, alert  and oriented  Airway and Oxygen Therapy: Patient Spontanous Breathing  Post-op Pain: moderate  Post-op Assessment: Post-op Vital signs reviewed, Patient's Cardiovascular Status Stable, Respiratory Function Stable, Patent Airway, No signs of Nausea or vomiting and Pain level controlled  Post-op Vital Signs: Reviewed and stable  Complications: No apparent anesthesia complications

## 2011-04-11 NOTE — Discharge Instructions (Signed)

## 2011-04-14 ENCOUNTER — Encounter (HOSPITAL_COMMUNITY): Payer: Self-pay | Admitting: Obstetrics and Gynecology

## 2011-11-02 ENCOUNTER — Ambulatory Visit: Payer: Self-pay | Admitting: Family Medicine

## 2011-11-02 VITALS — BP 130/77 | HR 98 | Temp 98.5°F | Resp 16 | Ht 66.5 in | Wt 212.0 lb

## 2011-11-02 DIAGNOSIS — K5190 Ulcerative colitis, unspecified, without complications: Secondary | ICD-10-CM

## 2011-11-02 DIAGNOSIS — R109 Unspecified abdominal pain: Secondary | ICD-10-CM

## 2011-11-02 DIAGNOSIS — R21 Rash and other nonspecific skin eruption: Secondary | ICD-10-CM

## 2011-11-02 MED ORDER — METHOCARBAMOL 500 MG PO TABS: 500.0000 mg | ORAL_TABLET | Freq: Three times a day (TID) | ORAL | Status: AC | PRN

## 2011-11-02 MED ORDER — NYSTATIN EX POWD: CUTANEOUS | Status: DC

## 2011-11-02 MED ORDER — OXYCODONE-ACETAMINOPHEN 5-325 MG PO TABS: 1.0000 | ORAL_TABLET | Freq: Three times a day (TID) | ORAL | Status: AC | PRN

## 2011-11-02 MED ORDER — TRAMADOL HCL 50 MG PO TABS: 50.0000 mg | ORAL_TABLET | Freq: Three times a day (TID) | ORAL | Status: AC | PRN

## 2011-11-02 NOTE — Progress Notes (Signed)
[facility]:  Office Visit  Chief Complaint:  Chief Complaint  Patient presents with  . Chest Pain    patient state she has slipped rib   . Ulcerative Colitis    HPI: Crystal Hatfield is a 31 y.o. female who complains of  Chronic left sided abd pain and rib pain, secondary to UC. She was seen by Dr. Collene Mares dx with UC, then referred to [facility] for exploratory lap by ob /gyn. Found to have adhesions. Was sent to another [facility] gastroenterologist for second opinion and was told it is not GI related except for UC. She is on mesalamine. She was on Dilaudid and MS Contin. She has dull, sharp pain that keeps her up at night.   Past Medical History  Diagnosis Date  . Colitis   . Anxiety     no meds  . Anemia     borderline per pt - no meds  . Headache     history - OTC meds prn - last one 2 weeks  . Chronic back pain   . Arthritis     knees and back, otc meds prn  . Depression     no meds   Past Surgical History  Procedure Date  . Colonoscopy     x 3  . Arthroscopic surgery     right knee  . Laparoscopy 04/11/2011    Procedure: LAPAROSCOPY OPERATIVE;  Surgeon: Cyril Mourning, MD;  Location: [place];  Service: Gynecology;  Laterality: N/A;   History   Social History  . Marital Status: Single    Spouse Name: N/A    Number of Children: N/A  . Years of Education: N/A   Social History Main Topics  . Smoking status: Never Smoker   . Smokeless tobacco: Never Used  . Alcohol Use: Yes     socially  . Drug Use: Yes    Special: Marijuana     last use today - 04/07/11  . Sexually Active: Yes    Birth Control/ Protection: None     partner   Other Topics Concern  . None   Social History Narrative  . None   Family History  Problem Relation Age of Onset  . Hypertension Mother   . Diabetes Maternal Grandmother    Allergies  Allergen Reactions  . Sulfa Antibiotics Hives    Hives.   Prior to Admission medications   Medication Sig Start Date End  Date Taking? Authorizing Provider  HYDROmorphone (DILAUDID) 2 MG tablet Take 2 mg by mouth every 4 (four) hours as needed. For pain    Historical Provider, MD  mesalamine (LIALDA) 1.2 G EC tablet Take 2,400 mg by mouth 2 (two) times daily.     Historical Provider, MD  morphine (MS CONTIN) 15 MG 12 hr tablet Take 15 mg by mouth 2 (two) times daily as needed. For pain    Historical Provider, MD     ROS: The patient denies fevers, chills, night sweats, unintentional weight loss, palpitations, wheezing, dyspnea on exertion, nausea, vomiting, dysuria, hematuria, numbness, weakness, or tingling. + abd pain + blood in stools x 2   All other systems have been reviewed and were otherwise negative with the exception of those mentioned in the HPI and as above.    PHYSICAL EXAM: Filed Vitals:   11/02/11 0810  BP: 130/77  Pulse: 98  Temp: 98.5 F (36.9 C)  Resp: 16   Filed Vitals:   11/02/11 0810  Height:  5' 6.5" (1.689 m)  Weight: 212 lb (96.163 kg)   Body mass index is 33.71 kg/(m^2).  General: Alert, no acute distress HEENT:  Normocephalic, atraumatic, oropharynx patent.  Cardiovascular:  Regular rate and rhythm, no rubs murmurs or gallops.  No Carotid bruits, radial pulse intact. No pedal edema.  Respiratory: Clear to auscultation bilaterally.  No wheezes, rales, or rhonchi.  No cyanosis, no use of accessory musculature GI: No organomegaly, abdomen is soft and mild-tender LUQ, LLQ, positive bowel sounds.  No masses. Skin: + candidiasis underneath breast Neurologic: Facial musculature symmetric. Psychiatric: Patient is appropriate throughout our interaction. Lymphatic: No cervical lymphadenopathy Musculoskeletal: Gait intact.   LABS: Results for orders placed during the hospital encounter of 04/11/11  PREGNANCY, URINE      Component Value Range   Preg Test, Ur NEGATIVE  NEGATIVE     EKG/XRAY:   Primary read interpreted by Dr. Marin Comment at [facility].   ASSESSMENT/PLAN: Encounter  Diagnoses  Name Primary?  . Abdominal pain Yes  . Ulcerative colitis    Rx: Tramadol, Robaxin and Roxicet. Patient knows that I do not prescribe MS Contin, Dilaudid at first encounter. Recommend she go back to Dr. Collene Mares or her PCP. I gave her enough medications to hold her over until her insurance kicks in in December with her new job. She denies any narcotic abuse problems.   Rx: Nystatin powder for yeast underneath breast    LE, THAO PHUONG, DO 11/02/2011 8:40 AM

## 2012-02-15 ENCOUNTER — Emergency Department (HOSPITAL_COMMUNITY)
Admission: EM | Admit: 2012-02-15 | Discharge: 2012-02-15 | Disposition: A | Discharge status: Home / Self Care | Payer: BC Managed Care – PPO | Attending: Emergency Medicine | Admitting: Emergency Medicine

## 2012-02-15 ENCOUNTER — Encounter (HOSPITAL_COMMUNITY): Payer: Self-pay | Admitting: *Deleted

## 2012-02-15 DIAGNOSIS — Z7901 Long term (current) use of anticoagulants: Secondary | ICD-10-CM | POA: Insufficient documentation

## 2012-02-15 DIAGNOSIS — Z862 Personal history of diseases of the blood and blood-forming organs and certain disorders involving the immune mechanism: Secondary | ICD-10-CM | POA: Insufficient documentation

## 2012-02-15 DIAGNOSIS — G8929 Other chronic pain: Secondary | ICD-10-CM | POA: Insufficient documentation

## 2012-02-15 DIAGNOSIS — R109 Unspecified abdominal pain: Secondary | ICD-10-CM | POA: Insufficient documentation

## 2012-02-15 DIAGNOSIS — Z792 Long term (current) use of antibiotics: Secondary | ICD-10-CM | POA: Insufficient documentation

## 2012-02-15 DIAGNOSIS — K5190 Ulcerative colitis, unspecified, without complications: Secondary | ICD-10-CM | POA: Insufficient documentation

## 2012-02-15 DIAGNOSIS — T7840XA Allergy, unspecified, initial encounter: Secondary | ICD-10-CM

## 2012-02-15 DIAGNOSIS — M549 Dorsalgia, unspecified: Secondary | ICD-10-CM | POA: Insufficient documentation

## 2012-02-15 DIAGNOSIS — IMO0002 Reserved for concepts with insufficient information to code with codable children: Secondary | ICD-10-CM | POA: Insufficient documentation

## 2012-02-15 DIAGNOSIS — Z8659 Personal history of other mental and behavioral disorders: Secondary | ICD-10-CM | POA: Insufficient documentation

## 2012-02-15 DIAGNOSIS — T368X5A Adverse effect of other systemic antibiotics, initial encounter: Secondary | ICD-10-CM | POA: Insufficient documentation

## 2012-02-15 DIAGNOSIS — Z3202 Encounter for pregnancy test, result negative: Secondary | ICD-10-CM | POA: Insufficient documentation

## 2012-02-15 DIAGNOSIS — Z8739 Personal history of other diseases of the musculoskeletal system and connective tissue: Secondary | ICD-10-CM | POA: Insufficient documentation

## 2012-02-15 DIAGNOSIS — R1310 Dysphagia, unspecified: Secondary | ICD-10-CM | POA: Insufficient documentation

## 2012-02-15 DIAGNOSIS — Z888 Allergy status to other drugs, medicaments and biological substances status: Secondary | ICD-10-CM | POA: Insufficient documentation

## 2012-02-15 LAB — URINALYSIS, ROUTINE W REFLEX MICROSCOPIC
Protein, ur: NEGATIVE mg/dL
Specific Gravity, Urine: 1.02 (ref 1.005–1.030)
Urobilinogen, UA: 0.2 mg/dL (ref 0.0–1.0)

## 2012-02-15 LAB — POCT PREGNANCY, URINE: Preg Test, Ur: NEGATIVE

## 2012-02-15 LAB — URINE MICROSCOPIC-ADD ON

## 2012-02-15 MED ORDER — HYDROMORPHONE HCL PF 1 MG/ML IJ SOLN
1.0000 mg | Freq: Once | INTRAMUSCULAR | Status: AC
  Administered 2012-02-15: 1 mg via INTRAVENOUS
  Filled 2012-02-15: qty 1

## 2012-02-15 MED ORDER — METHYLPREDNISOLONE SODIUM SUCC 125 MG IJ SOLR
125.0000 mg | Freq: Once | INTRAMUSCULAR | Status: AC
  Administered 2012-02-15: 125 mg via INTRAVENOUS
  Filled 2012-02-15: qty 2

## 2012-02-15 MED ORDER — DIPHENHYDRAMINE HCL 50 MG/ML IJ SOLN
50.0000 mg | Freq: Once | INTRAMUSCULAR | Status: AC
  Administered 2012-02-15: 50 mg via INTRAVENOUS
  Filled 2012-02-15: qty 1

## 2012-02-15 MED ORDER — FAMOTIDINE IN NACL 20-0.9 MG/50ML-% IV SOLN
20.0000 mg | Freq: Once | INTRAVENOUS | Status: AC
  Administered 2012-02-15: 20 mg via INTRAVENOUS
  Filled 2012-02-15: qty 50

## 2012-02-15 NOTE — ED Notes (Signed)
Pt reports allergic reaction/ rash x1 day. Hx of ulcerative colitis - currently has flare up. MD prescribed Cipro for blood in urine, prednisone, Hydromorphone. Presents with pain in stomach which is worse today, redness to skin which is better today. Took benadryl at 10pm last night. Reports difficulty swalloing, feels congested, denies SOB.

## 2012-02-15 NOTE — ED Provider Notes (Signed)
History     CSN: [id number]  Arrival date & time 02/15/12  6644   First MD Initiated Contact with Patient 02/15/12 0935      No chief complaint on file.   (Consider location/radiation/quality/duration/timing/severity/associated sxs/prior treatment) HPI Patient developed pruritic rash diffusely possibly 5 PM yesterday one hour after taking Cipro for presumed urinary tract infection. She denies shortness of breath denies pain on swallowing. Admits to mild difficulty swallowing denies chest pain. Admits to left-sided abdominal pain which is chronic for several years and unchanged 22 ulcerative colitis. She treated herself with Benadryl 10 PM last night with transient partial relief. No other associated symptoms rash is diffuse and pruritic Past Medical History  Diagnosis Date  . Colitis   . Anxiety     no meds  . Anemia     borderline per pt - no meds  . Headache     history - OTC meds prn - last one 2 weeks  . Chronic back pain   . Arthritis     knees and back, otc meds prn  . Depression     no meds    Past Surgical History  Procedure Date  . Colonoscopy     x 3  . Arthroscopic surgery     right knee  . Laparoscopy 04/11/2011    Procedure: LAPAROSCOPY OPERATIVE;  Surgeon: Cyril Mourning, MD;  Location: [facility];  Service: Gynecology;  Laterality: N/A;    Family History  Problem Relation Age of Onset  . Hypertension Mother   . Diabetes Maternal Grandmother     History  Substance Use Topics  . Smoking status: Never Smoker   . Smokeless tobacco: Never Used  . Alcohol Use: Yes     Comment: socially    OB History    Grav Para Term Preterm Abortions TAB SAB Ect Mult Living                  Review of Systems  HENT: Positive for trouble swallowing.   Skin: Positive for rash.    Allergies  Sulfa antibiotics  Home Medications   Current Outpatient Rx  Name  Route  Sig  Dispense  Refill  . HYDROMORPHONE HCL 2 MG PO TABS   Oral   Take 2 mg by mouth every 4  (four) hours as needed. For pain         . MESALAMINE 1.2 G PO TBEC   Oral   Take 2,400 mg by mouth 2 (two) times daily.          . MORPHINE SULFATE ER 15 MG PO TB12   Oral   Take 15 mg by mouth 2 (two) times daily as needed. For pain         . NYSTATIN EX POWD      Apply to affected area BID prn   1 Bottle   2     BP 109/91  Pulse 106  Temp 99 F (37.2 C) (Oral)  Resp 15  SpO2 96%  LMP 02/04/2012  Physical Exam  Nursing note and vitals reviewed. Constitutional: She appears well-developed and well-nourished. No distress.       Haloing secretions well no distress. Speaks in paragraphs  HENT:  Head: Normocephalic and atraumatic.  Mouth/Throat: Oropharyngeal exudate present.       Oropharynx normal, no hoarseness  Eyes: Conjunctivae normal are normal. Pupils are equal, round, and reactive to light.  Neck: Neck supple. No tracheal deviation present. No thyromegaly present.  Cardiovascular: Normal rate and regular rhythm.   No murmur heard. Pulmonary/Chest: Effort normal and breath sounds normal.  Abdominal: Soft. Bowel sounds are normal. She exhibits no distension. There is no tenderness.  Musculoskeletal: Normal range of motion. She exhibits no edema and no tenderness.  Neurological: She is alert. Coordination normal.  Skin: Skin is warm and dry. No rash noted. No erythema.       Diffusely erythematous face trunk and extremities  Psychiatric: She has a normal mood and affect.    ED Course  Procedures (including critical care time)   Labs Reviewed  URINALYSIS, ROUTINE W REFLEX MICROSCOPIC   No results found.   No diagnosis found.   10:15 AM reports that itching has stopped she complains of diffuse abdominal pain which is typical of her ulcerative colitis. Hydromorphone ordered. I exam skin remains reddened, no respiratory distress and abdomen is minimally diffusely tender   1:50 PM feels improved after treatment with hydromorphone. Rash is almost gone.  No longer itching.  Results for orders placed during the hospital encounter of 02/15/12  URINALYSIS, ROUTINE W REFLEX MICROSCOPIC      Component Value Range   Color, Urine YELLOW  YELLOW   APPearance CLOUDY (*) CLEAR   Specific Gravity, Urine 1.020  1.005 - 1.030   pH 6.0  5.0 - 8.0   Glucose, UA NEGATIVE  NEGATIVE mg/dL   Hgb urine dipstick MODERATE (*) NEGATIVE   Bilirubin Urine NEGATIVE  NEGATIVE   Ketones, ur NEGATIVE  NEGATIVE mg/dL   Protein, ur NEGATIVE  NEGATIVE mg/dL   Urobilinogen, UA 0.2  0.0 - 1.0 mg/dL   Nitrite NEGATIVE  NEGATIVE   Leukocytes, UA SMALL (*) NEGATIVE  POCT PREGNANCY, URINE      Component Value Range   Preg Test, Ur NEGATIVE  NEGATIVE  URINE MICROSCOPIC-ADD ON      Component Value Range   Squamous Epithelial / LPF FEW (*) RARE   WBC, UA 0-2  <3 WBC/hpf   RBC / HPF 0-2  <3 RBC/hpf   Bacteria, UA FEW (*) RARE   Casts HYALINE CASTS (*) NEGATIVE   No results found.  MDM  Plan patient is currently on the first of a prednisone taper prescribed by Dr.Mann. She can continue prednisone taper. Stop Cipro. Benadryl 50 mg every 6 hours as needed for itch Diagnosis #1 allergic reaction to Cipro #2 chronic abdominal pain        Orlie Dakin, MD 02/15/12 1359

## 2012-02-15 NOTE — ED Notes (Signed)
Pt mother Ennis Forts # [phone]

## 2012-02-24 ENCOUNTER — Emergency Department (HOSPITAL_COMMUNITY)
Admission: EM | Admit: 2012-02-24 | Discharge: 2012-02-24 | Disposition: A | Discharge status: Home / Self Care | Payer: BC Managed Care – PPO | Attending: Emergency Medicine | Admitting: Emergency Medicine

## 2012-02-24 ENCOUNTER — Emergency Department (HOSPITAL_COMMUNITY): Payer: BC Managed Care – PPO

## 2012-02-24 DIAGNOSIS — S79919A Unspecified injury of unspecified hip, initial encounter: Secondary | ICD-10-CM | POA: Insufficient documentation

## 2012-02-24 DIAGNOSIS — Z862 Personal history of diseases of the blood and blood-forming organs and certain disorders involving the immune mechanism: Secondary | ICD-10-CM | POA: Insufficient documentation

## 2012-02-24 DIAGNOSIS — Y929 Unspecified place or not applicable: Secondary | ICD-10-CM | POA: Insufficient documentation

## 2012-02-24 DIAGNOSIS — Z8659 Personal history of other mental and behavioral disorders: Secondary | ICD-10-CM | POA: Insufficient documentation

## 2012-02-24 DIAGNOSIS — G8929 Other chronic pain: Secondary | ICD-10-CM | POA: Insufficient documentation

## 2012-02-24 DIAGNOSIS — Z79899 Other long term (current) drug therapy: Secondary | ICD-10-CM | POA: Insufficient documentation

## 2012-02-24 DIAGNOSIS — K5289 Other specified noninfective gastroenteritis and colitis: Secondary | ICD-10-CM | POA: Insufficient documentation

## 2012-02-24 DIAGNOSIS — R296 Repeated falls: Secondary | ICD-10-CM | POA: Insufficient documentation

## 2012-02-24 DIAGNOSIS — Y9301 Activity, walking, marching and hiking: Secondary | ICD-10-CM | POA: Insufficient documentation

## 2012-02-24 DIAGNOSIS — M5430 Sciatica, unspecified side: Secondary | ICD-10-CM

## 2012-02-24 DIAGNOSIS — S79929A Unspecified injury of unspecified thigh, initial encounter: Secondary | ICD-10-CM | POA: Insufficient documentation

## 2012-02-24 DIAGNOSIS — M129 Arthropathy, unspecified: Secondary | ICD-10-CM | POA: Insufficient documentation

## 2012-02-24 MED ORDER — KETOROLAC TROMETHAMINE 30 MG/ML IJ SOLN
30.0000 mg | Freq: Once | INTRAMUSCULAR | Status: DC
  Filled 2012-02-24: qty 1

## 2012-02-24 MED ORDER — HYDROCODONE-ACETAMINOPHEN 5-325 MG PO TABS
2.0000 | ORAL_TABLET | Freq: Once | ORAL | Status: AC
  Administered 2012-02-24: 2 via ORAL
  Filled 2012-02-24: qty 1

## 2012-02-24 MED ORDER — HYDROMORPHONE HCL 4 MG PO TABS: ORAL_TABLET | ORAL | Status: DC

## 2012-02-24 MED ORDER — HYDROMORPHONE HCL PF 2 MG/ML IJ SOLN
2.0000 mg | Freq: Once | INTRAMUSCULAR | Status: AC
  Administered 2012-02-24: 2 mg via INTRAMUSCULAR
  Filled 2012-02-24: qty 1

## 2012-02-24 NOTE — ED Notes (Signed)
[id number]> Expected date:<BR> Expected time:<BR> Means of arrival:<BR> Comments:<BR> ems

## 2012-02-24 NOTE — ED Provider Notes (Signed)
History     CSN: [id number]  Arrival date & time 02/24/12  0910   First MD Initiated Contact with Patient 02/24/12 [phone]      Chief Complaint  Patient presents with  . Fall  . Flank Pain  . Hip Pain     HPI Per EMS: Pt fell on Sunday while walking her dog. Now c/o of right flank, hip and thigh pain (9/10). Tried taking hydromorphone with no relief  Past Medical History  Diagnosis Date  . Colitis   . Anxiety     no meds  . Anemia     borderline per pt - no meds  . Headache     history - OTC meds prn - last one 2 weeks  . Chronic back pain   . Arthritis     knees and back, otc meds prn  . Depression     no meds    Past Surgical History  Procedure Date  . Colonoscopy     x 3  . Arthroscopic surgery     right knee  . Laparoscopy 04/11/2011    Procedure: LAPAROSCOPY OPERATIVE;  Surgeon: Cyril Mourning, MD;  Location: [facility];  Service: Gynecology;  Laterality: N/A;    Family History  Problem Relation Age of Onset  . Hypertension Mother   . Diabetes Maternal Grandmother     History  Substance Use Topics  . Smoking status: Never Smoker   . Smokeless tobacco: Never Used  . Alcohol Use: Yes     Comment: socially    OB History    Grav Para Term Preterm Abortions TAB SAB Ect Mult Living                  Review of Systems All other systems reviewed and are negative Allergies  Ciprofloxacin and Sulfa antibiotics  Home Medications   Current Outpatient Rx  Name  Route  Sig  Dispense  Refill  . MESALAMINE 1.2 G PO TBEC   Oral   Take 3,600 mg by mouth 2 (two) times daily.          Marland Kitchen HYDROMORPHONE HCL 4 MG PO TABS      For pain.  Take 1 tablet every 8 hours as needed   30 tablet   0   . PREDNISONE 10 MG PO TABS   Oral   Take 5-40 mg by mouth daily. Taper down 1 tab each week, until 1 tab daily, then taper down to 0.5 tab daily for one last week.  Started 02/14/12           BP 107/62  Pulse 76  Temp 98.8 F (37.1 C) (Oral)  Resp 16   SpO2 100%  LMP 02/04/2012  Physical Exam  Nursing note and vitals reviewed. Constitutional: She is oriented to person, place, and time. She appears well-developed and well-nourished. No distress.  HENT:  Head: Normocephalic and atraumatic.  Eyes: Pupils are equal, round, and reactive to light.  Neck: Normal range of motion.  Cardiovascular: Normal rate and intact distal pulses.   Pulmonary/Chest: No respiratory distress.  Abdominal: Normal appearance. She exhibits no distension.  Musculoskeletal:       Back:       Pain with radiation is indicated  Neurological: She is alert and oriented to person, place, and time. No cranial nerve deficit.  Skin: Skin is warm and dry. No rash noted.  Psychiatric: She has a normal mood and affect. Her behavior is normal.  ED Course  Procedures (including critical care time)  Labs Reviewed - No data to display Dg Lumbar Spine Complete  02/24/2012  *RADIOLOGY REPORT*  Clinical Data: [place].  Back pain.  LUMBAR SPINE - COMPLETE 4+ VIEW  Comparison: None  Findings: The lateral film demonstrates normal alignment. Vertebral bodies and disc spaces are maintained.  No acute bony findings.  Normal alignment of the facet joints and no pars defects.  The visualized bony pelvis in intact.  IMPRESSION: Normal alignment and no acute bony findings.   Original Report Authenticated By: Marijo Sanes, M.D.    Dg Pelvis 1-2 Views  02/24/2012  *RADIOLOGY REPORT*  Clinical Data: [place].  Pelvic pain.  PELVIS - 1-2 VIEW  Comparison: None  Findings: The hips are normally located.  No acute hip fracture or plain film evidence for avascular necrosis.  The pubic symphysis and SI joints are intact.  No pelvic fractures.  IMPRESSION: No acute bony findings.   Original Report Authenticated By: Marijo Sanes, M.D.      1. Sciatica       MDM         Dot Lanes, MD 02/25/12 [phone]

## 2012-02-24 NOTE — ED Notes (Signed)
Per EMS: Pt fell on Sunday while walking her dog. Now c/o of right flank, hip and thigh pain (9/10). Tried taking hydromorphone with no relief

## 2012-03-07 ENCOUNTER — Emergency Department (HOSPITAL_COMMUNITY)
Admission: EM | Admit: 2012-03-07 | Discharge: 2012-03-08 | Disposition: A | Discharge status: Home / Self Care | Payer: BC Managed Care – PPO | Attending: Emergency Medicine | Admitting: Emergency Medicine

## 2012-03-07 ENCOUNTER — Encounter (HOSPITAL_COMMUNITY): Payer: Self-pay | Admitting: *Deleted

## 2012-03-07 DIAGNOSIS — Z791 Long term (current) use of non-steroidal anti-inflammatories (NSAID): Secondary | ICD-10-CM | POA: Insufficient documentation

## 2012-03-07 DIAGNOSIS — IMO0002 Reserved for concepts with insufficient information to code with codable children: Secondary | ICD-10-CM | POA: Insufficient documentation

## 2012-03-07 DIAGNOSIS — Z79899 Other long term (current) drug therapy: Secondary | ICD-10-CM | POA: Insufficient documentation

## 2012-03-07 DIAGNOSIS — Z8679 Personal history of other diseases of the circulatory system: Secondary | ICD-10-CM | POA: Insufficient documentation

## 2012-03-07 DIAGNOSIS — Z8659 Personal history of other mental and behavioral disorders: Secondary | ICD-10-CM | POA: Insufficient documentation

## 2012-03-07 DIAGNOSIS — G8929 Other chronic pain: Secondary | ICD-10-CM | POA: Insufficient documentation

## 2012-03-07 DIAGNOSIS — K5289 Other specified noninfective gastroenteritis and colitis: Secondary | ICD-10-CM | POA: Insufficient documentation

## 2012-03-07 DIAGNOSIS — L0390 Cellulitis, unspecified: Secondary | ICD-10-CM

## 2012-03-07 DIAGNOSIS — Z862 Personal history of diseases of the blood and blood-forming organs and certain disorders involving the immune mechanism: Secondary | ICD-10-CM | POA: Insufficient documentation

## 2012-03-07 DIAGNOSIS — M129 Arthropathy, unspecified: Secondary | ICD-10-CM | POA: Insufficient documentation

## 2012-03-07 NOTE — ED Notes (Signed)
Pt states in December developed small amount swelling redness right elbow; saw pcp at that time and had labwork and told all labs were ok; takes Prednisone for ulcerative colitis; had an allergic reaction to cipro in December; has continued to have increased swelling/pain/redness/warmth to right elbow;

## 2012-03-07 NOTE — ED Notes (Addendum)
Patient has baseball sized reddened and swollen area to right forearm, near elbow. Patient states it started as a small sore spot. Patient states seeing her PCP in December, lab work was completed at that time, and patient states she was told everything was normal. Patient reports she has had two ER visits, one for allergic reaction and one for sciatic back pain in the last month. Patient comes in tonight due to increased area of redness, swelling, and pain. Patient taking 84m PO Dilaudid for other pain issues, reports last dose at @1730 .

## 2012-03-08 MED ORDER — CEPHALEXIN 500 MG PO CAPS: 500.0000 mg | ORAL_CAPSULE | Freq: Four times a day (QID) | ORAL | Status: DC

## 2012-03-08 NOTE — ED Provider Notes (Signed)
History     CSN: [id number]  Arrival date & time 03/07/12  2303   First MD Initiated Contact with Patient 03/07/12 2339      Chief Complaint  Patient presents with  . R arm pain     (Consider location/radiation/quality/duration/timing/severity/associated sxs/prior treatment) The history is provided by the patient. No language interpreter was used.  32yo female with small patch of cellulitis to R forearm.  No elbow/joint  Pain.  No fever n/v.   Past Medical History  Diagnosis Date  . Colitis   . Anxiety     no meds  . Anemia     borderline per pt - no meds  . Headache     history - OTC meds prn - last one 2 weeks  . Chronic back pain   . Arthritis     knees and back, otc meds prn  . Depression     no meds    Past Surgical History  Procedure Date  . Colonoscopy     x 3  . Arthroscopic surgery     right knee  . Laparoscopy 04/11/2011    Procedure: LAPAROSCOPY OPERATIVE;  Surgeon: Cyril Mourning, MD;  Location: [facility];  Service: Gynecology;  Laterality: N/A;    Family History  Problem Relation Age of Onset  . Hypertension Mother   . Diabetes Maternal Grandmother     History  Substance Use Topics  . Smoking status: Never Smoker   . Smokeless tobacco: Never Used  . Alcohol Use: Yes     Comment: socially    OB History    Grav Para Term Preterm Abortions TAB SAB Ect Mult Living                  Review of Systems  Constitutional: Negative.   HENT: Negative.   Eyes: Negative.   Respiratory: Negative.   Cardiovascular: Negative.   Gastrointestinal: Negative.   Skin:       tendernes to posterior R forearm  Neurological: Negative.   Psychiatric/Behavioral: Negative.   All other systems reviewed and are negative.    Allergies  Ciprofloxacin and Sulfa antibiotics  Home Medications   Current Outpatient Rx  Name  Route  Sig  Dispense  Refill  . FISH OIL-FLAX OIL-BORAGE OIL PO CAPS   Oral   Take 2 capsules by mouth every morning.         Marland Kitchen  HYDROMORPHONE HCL 4 MG PO TABS      4 mg every 8 (eight) hours as needed. For pain.  Take 1 tablet every 8 hours as needed         . MESALAMINE 1.2 G PO TBEC   Oral   Take 3,600 mg by mouth 2 (two) times daily.          Marland Kitchen OVER THE COUNTER MEDICATION   Oral   Take 1 capsule by mouth every morning. L-carnitine 500 mg with raspberry ketones 100 mg         . OVER THE COUNTER MEDICATION   Oral   Take 2 capsules by mouth every morning. Green coffee ,garcinia, green tea supplement         . PREDNISONE 10 MG PO TABS   Oral   Take 5-40 mg by mouth daily. Taper down 1 tablet less each week.  Start with 4 tablets for 7days ,then 3 tablets for 7 days, then 2 tablets for 7 days, then 1 tablet for 7 days then 1/2 tablet for  7 days.  Started 02/14/12         . CEPHALEXIN 500 MG PO CAPS   Oral   Take 1 capsule (500 mg total) by mouth 4 (four) times daily.   20 capsule   0     BP 120/77  Pulse 78  Temp 98.3 F (36.8 C)  Resp 20  SpO2 98%  LMP 02/29/2012  Physical Exam  Nursing note and vitals reviewed. Constitutional: She is oriented to person, place, and time. She appears well-developed and well-nourished.  HENT:  Head: Normocephalic and atraumatic.  Eyes: Conjunctivae normal and EOM are normal. Pupils are equal, round, and reactive to light.  Neck: Normal range of motion. Neck supple.  Cardiovascular: Normal rate.   Pulmonary/Chest: Effort normal.  Abdominal: Soft.  Musculoskeletal: Normal range of motion. She exhibits no edema and no tenderness.  Neurological: She is alert and oriented to person, place, and time. She has normal reflexes.  Skin: Skin is warm and dry.       6cm area of cellulitistenderness without fluctuance to R forearm  Psychiatric: She has a normal mood and affect.    ED Course  Procedures (including critical care time)  Labs Reviewed - No data to display No results found.   1. Cellulitis       MDM  Cellulitis without fever, n/v to R  forearm.  Area marked and she will return in 2 days for recheck.  rx for cephalexin.  Non toxic appearance.  She understands to return sooner for worsening symptoms.  No joint involvement.  Allergic to sulfa.         Julieta Bellini, NP 03/08/12 1426

## 2012-03-08 NOTE — ED Notes (Signed)
Discharge instructions reviewed. Rx given x1. All questions answered.

## 2012-03-08 NOTE — Discharge Instructions (Signed)
Ms Cotta the infection on your arm is called cellulitis.  Start the antibiotic as soon as possible.  Recheck in 2 days here or your pcp.   Return sooner for high fever, nausea, vomiting, increasing area of redness.

## 2012-03-10 NOTE — ED Provider Notes (Signed)
Medical screening examination/treatment/procedure(s) were performed by non-physician practitioner and as supervising physician I was immediately available for consultation/collaboration.   Wynetta Fines, MD 03/10/12 2250

## 2012-04-15 ENCOUNTER — Encounter (HOSPITAL_COMMUNITY): Payer: Self-pay | Admitting: Emergency Medicine

## 2012-04-15 ENCOUNTER — Other Ambulatory Visit: Payer: Self-pay

## 2012-04-15 ENCOUNTER — Emergency Department (HOSPITAL_COMMUNITY)
Admission: EM | Admit: 2012-04-15 | Discharge: 2012-04-15 | Disposition: A | Discharge status: Home / Self Care | Payer: BC Managed Care – PPO | Attending: Emergency Medicine | Admitting: Emergency Medicine

## 2012-04-15 ENCOUNTER — Emergency Department (HOSPITAL_COMMUNITY): Payer: BC Managed Care – PPO

## 2012-04-15 DIAGNOSIS — F411 Generalized anxiety disorder: Secondary | ICD-10-CM | POA: Insufficient documentation

## 2012-04-15 DIAGNOSIS — Z8659 Personal history of other mental and behavioral disorders: Secondary | ICD-10-CM | POA: Insufficient documentation

## 2012-04-15 DIAGNOSIS — R079 Chest pain, unspecified: Secondary | ICD-10-CM

## 2012-04-15 DIAGNOSIS — Z862 Personal history of diseases of the blood and blood-forming organs and certain disorders involving the immune mechanism: Secondary | ICD-10-CM | POA: Insufficient documentation

## 2012-04-15 DIAGNOSIS — M549 Dorsalgia, unspecified: Secondary | ICD-10-CM | POA: Insufficient documentation

## 2012-04-15 DIAGNOSIS — R071 Chest pain on breathing: Secondary | ICD-10-CM | POA: Insufficient documentation

## 2012-04-15 DIAGNOSIS — Z8739 Personal history of other diseases of the musculoskeletal system and connective tissue: Secondary | ICD-10-CM | POA: Insufficient documentation

## 2012-04-15 DIAGNOSIS — Z79899 Other long term (current) drug therapy: Secondary | ICD-10-CM | POA: Insufficient documentation

## 2012-04-15 DIAGNOSIS — G8929 Other chronic pain: Secondary | ICD-10-CM | POA: Insufficient documentation

## 2012-04-15 MED ORDER — IBUPROFEN 600 MG PO TABS: 600.0000 mg | ORAL_TABLET | Freq: Three times a day (TID) | ORAL | Status: DC | PRN

## 2012-04-15 MED ORDER — HYDROCODONE-ACETAMINOPHEN 5-325 MG PO TABS: 1.0000 | ORAL_TABLET | ORAL | Status: DC | PRN

## 2012-04-15 MED ORDER — IBUPROFEN 200 MG PO TABS
600.0000 mg | ORAL_TABLET | Freq: Once | ORAL | Status: AC
  Administered 2012-04-15: 600 mg via ORAL
  Filled 2012-04-15: qty 3

## 2012-04-15 MED ORDER — OXYCODONE-ACETAMINOPHEN 5-325 MG PO TABS
1.0000 | ORAL_TABLET | Freq: Once | ORAL | Status: AC
  Administered 2012-04-15: 1 via ORAL
  Filled 2012-04-15: qty 1

## 2012-04-15 NOTE — ED Provider Notes (Signed)
History     CSN: [id number]  Arrival date & time 04/15/12  1009   First MD Initiated Contact with Patient 04/15/12 1030      Chief Complaint  Patient presents with  . Chest Pain   The history is provided by the patient.   patient reports 3 days of left-sided chest pain that is worse with movement of her left arm and deep breathing or coughing.  No recent falls or trauma.  No history of DVT or pulmonary embolism.  No recent surgery or long travel.  She does not take birth control nor does she smoke cigarettes.  No family history of clots.  She used to be on Dilaudid for chronic back pain and is currently between pain physicians.  She states that she has tried Tylenol at home without improvement in her symptoms.  She tries to avoid ibuprofen given her history of.  Colitis.  Her symptoms are mild to moderate in severity.  Nothing improves her pain.  Her pain is worsened as above.  No cough or congestion.  No shortness of breath.  No unilateral leg swelling the  Past Medical History  Diagnosis Date  . Colitis   . Anxiety     no meds  . Anemia     borderline per pt - no meds  . Headache     history - OTC meds prn - last one 2 weeks  . Chronic back pain   . Arthritis     knees and back, otc meds prn  . Depression     no meds    Past Surgical History  Procedure Laterality Date  . Colonoscopy      x 3  . Arthroscopic surgery      right knee  . Laparoscopy  04/11/2011    Procedure: LAPAROSCOPY OPERATIVE;  Surgeon: Cyril Mourning, MD;  Location: [facility];  Service: Gynecology;  Laterality: N/A;    Family History  Problem Relation Age of Onset  . Hypertension Mother   . Diabetes Maternal Grandmother     History  Substance Use Topics  . Smoking status: Never Smoker   . Smokeless tobacco: Never Used  . Alcohol Use: Yes     Comment: socially    OB History   Grav Para Term Preterm Abortions TAB SAB Ect Mult Living                  Review of Systems  All other systems  reviewed and are negative.    Allergies  Ciprofloxacin and Sulfa antibiotics  Home Medications   Current Outpatient Rx  Name  Route  Sig  Dispense  Refill  . gabapentin (NEURONTIN) 300 MG capsule   Oral   Take 300 mg by mouth 3 (three) times daily.         . mesalamine (LIALDA) 1.2 G EC tablet   Oral   Take 3,600 mg by mouth 2 (two) times daily.            BP 133/84  Pulse 82  Temp(Src) 98.2 F (36.8 C) (Oral)  SpO2 99%  Physical Exam  Nursing note and vitals reviewed. Constitutional: She is oriented to person, place, and time. She appears well-developed and well-nourished. No distress.  HENT:  Head: Normocephalic and atraumatic.  Eyes: EOM are normal.  Neck: Normal range of motion.  Cardiovascular: Normal rate, regular rhythm and normal heart sounds.   Pulmonary/Chest: Effort normal and breath sounds normal.  Tenderness of the left costo  chondral cartilage.  No rash noted.  Pain in left chest with range of motion of left arm.  Abdominal: Soft. She exhibits no distension. There is no tenderness.  Musculoskeletal: Normal range of motion.  Neurological: She is alert and oriented to person, place, and time.  Skin: Skin is warm and dry.  Psychiatric: She has a normal mood and affect. Judgment normal.    ED Course  Procedures (including critical care time)  Labs Reviewed - No data to display Dg Chest 2 View  04/15/2012  *RADIOLOGY REPORT*  Clinical Data: Mid to left chest pain with some shortness of breath and cough.  CHEST - 2 VIEW  Comparison: None.  Findings: The heart size and mediastinal contours are normal. The lungs are clear. There is no pleural effusion or pneumothorax. No acute osseous findings are identified.  IMPRESSION: No active cardiopulmonary process.   Original Report Authenticated By: Richardean Sale, M.D.    I personally reviewed the imaging tests through PACS system I reviewed available ER/hospitalization records through the EMR    Date:  04/15/2012  Rate: 94  Rhythm: normal sinus rhythm  QRS Axis: normal  Intervals: normal  ST/T Wave abnormalities: normal  Conduction Disutrbances: none  Narrative Interpretation:   Old EKG Reviewed: No significant changes noted     1. Chest pain       MDM  PERC negative. Dc home in good condition.  This appears to be chest wall pain.  EKG and chest x-ray normal.  Tenderness of the left side of her chest.        Hoy Morn, MD 04/15/12 1116

## 2012-04-15 NOTE — ED Notes (Signed)
Pt complains of chest pain to the left side of left. Pt reports "the pain is worse with deep breath and coughing"

## 2012-04-15 NOTE — ED Notes (Signed)
Pt states for the past 3-4 days has had L sided chest pain, sharp when taking a deep breath in, lifting arms up or coughing, pt denies blurred vision, n/v/d, states has some shortness of breath, and states has tingling in legs which is common for her. Pt denies cough or congestion until this morning states her nose was stuffy.

## 2012-04-19 IMAGING — US US RENAL
1 series · 14 of 25 positions shown · non-contrast
Comparison: CT dated 08/09/2009.

CLINICAL DATA: Left flank pain.  History of ulcerative colitis.

RENAL/URINARY TRACT ULTRASOUND COMPLETE

[Series 1: us renal · 0.35mm/px · 14 of 37 slices shown]
[im 1/37]
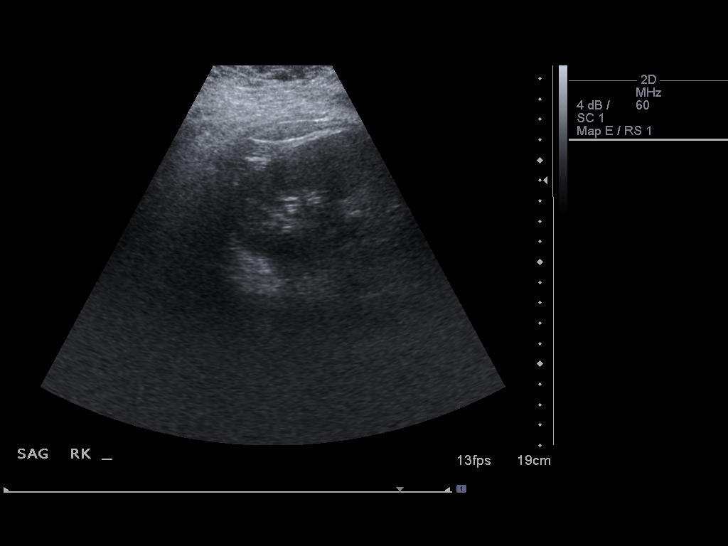
[im 4/37]
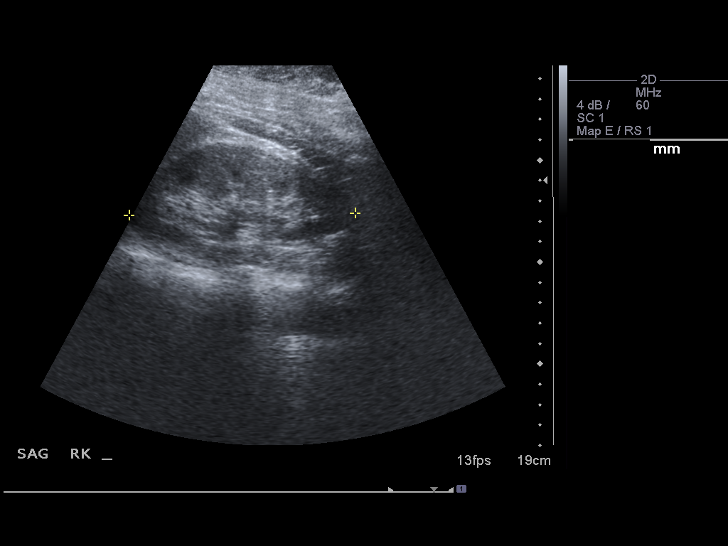
[im 7/37]
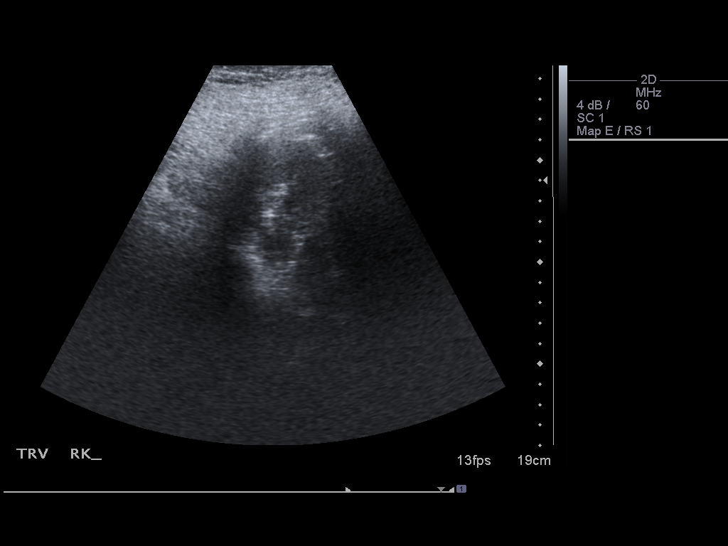
[im 10/37]
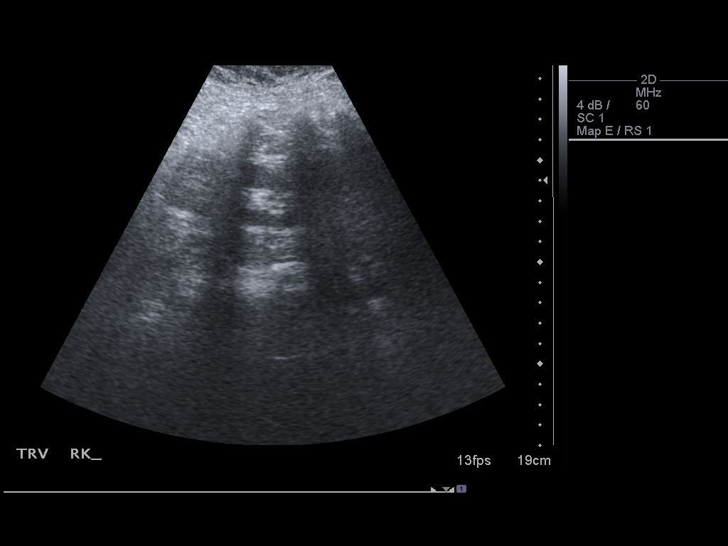
[im 13/37]
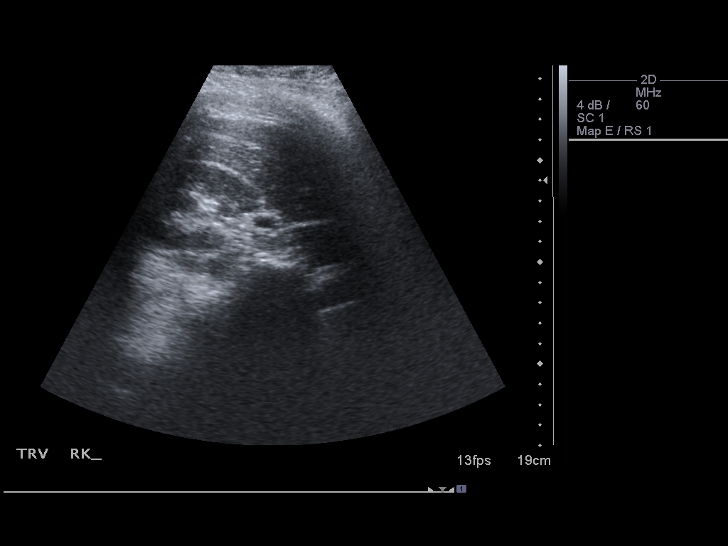
[im 14/37]
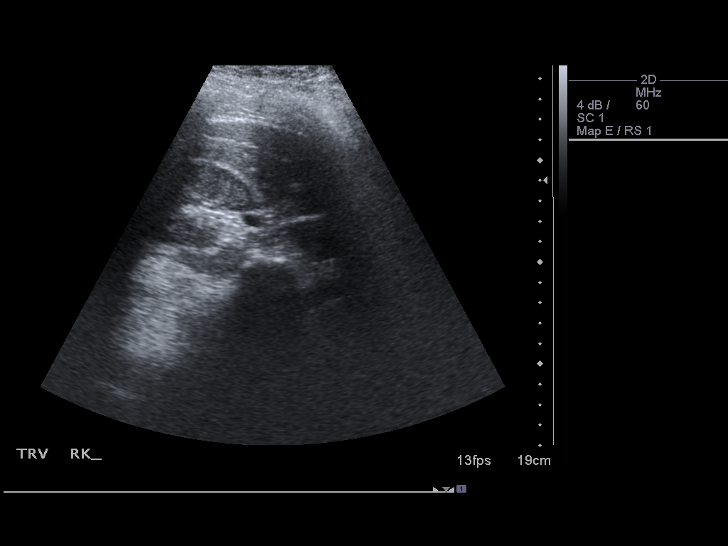
[im 17/37]
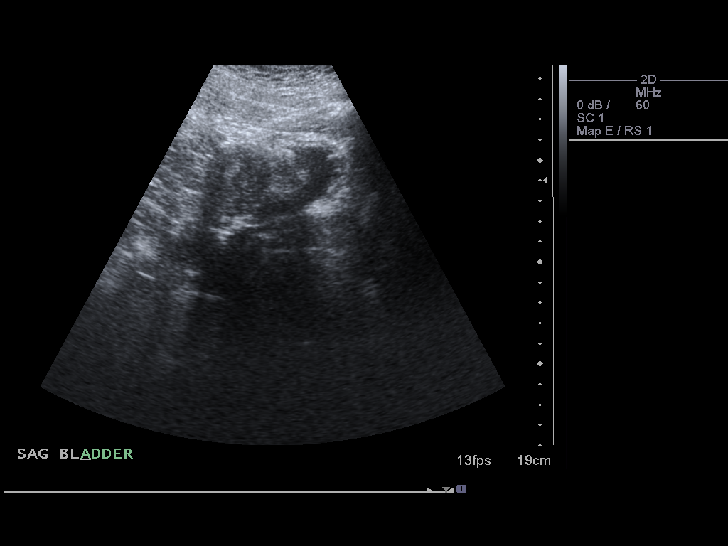
[im 20/37]
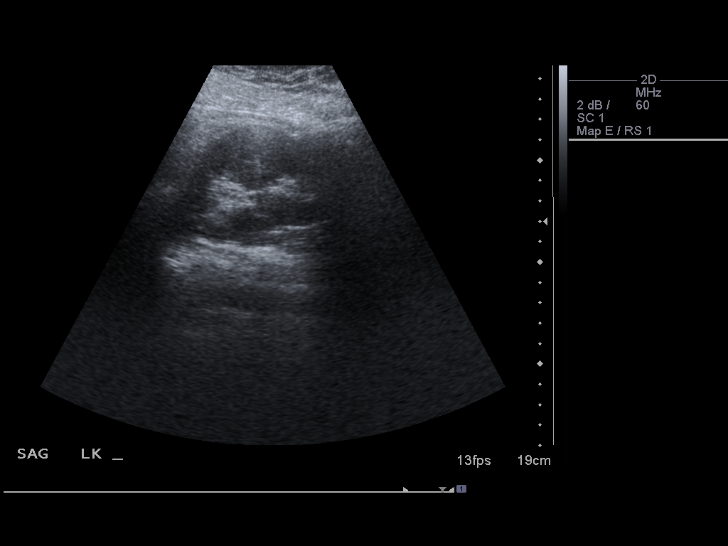
[im 23/37]
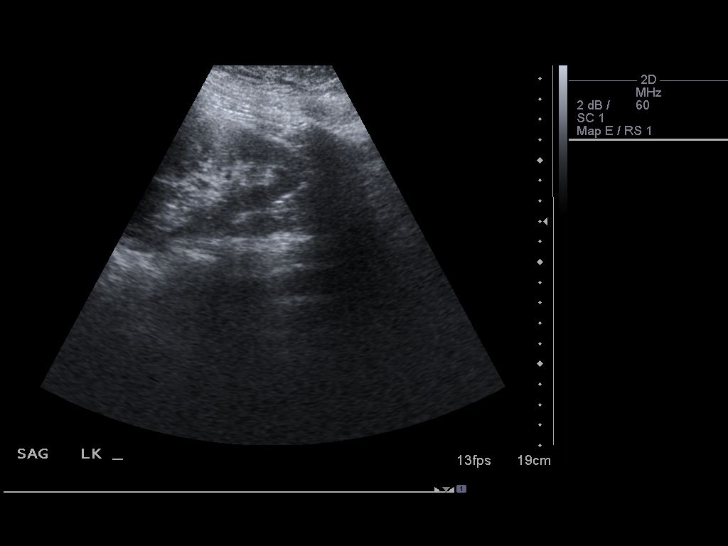
[im 25/37]
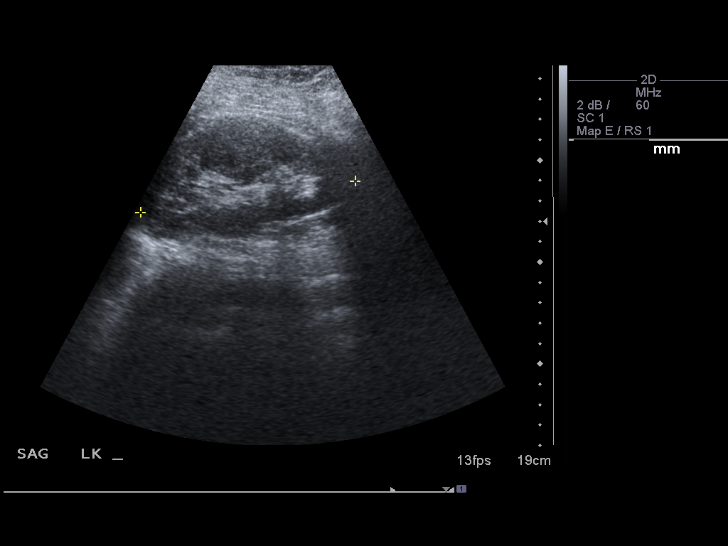
[im 28/37]
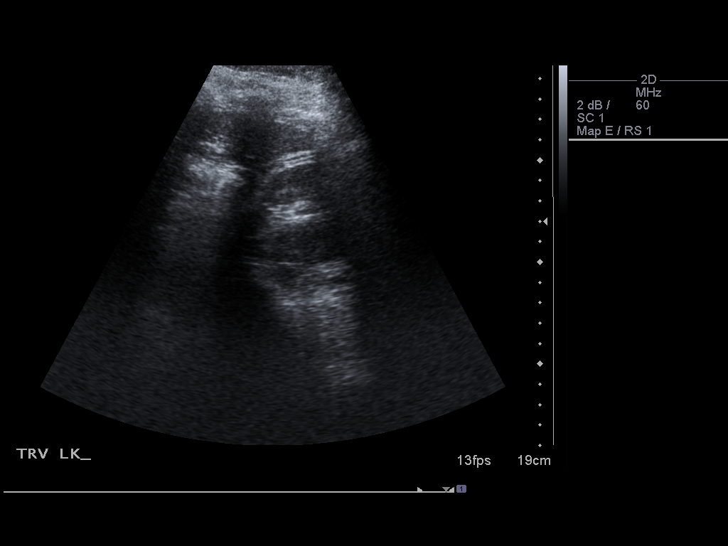
[im 31/37]
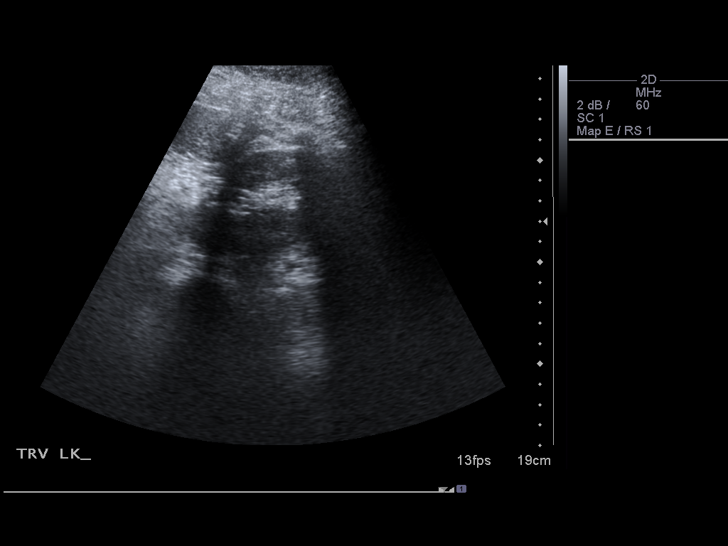
[im 34/37]
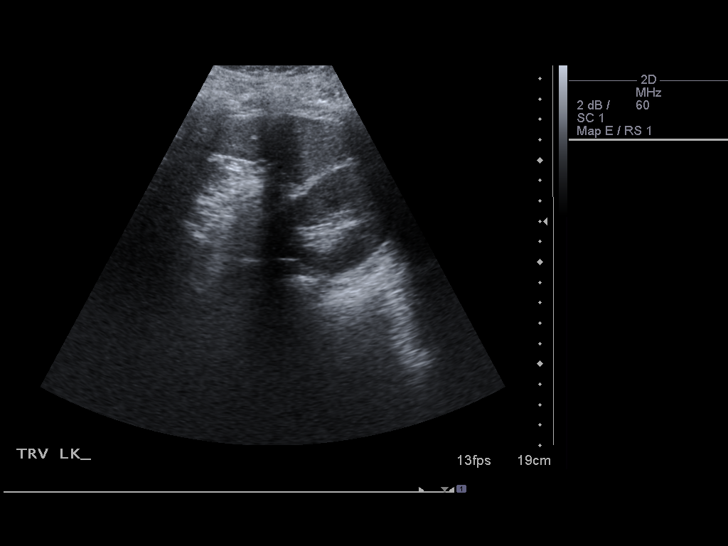
[im 37/37]
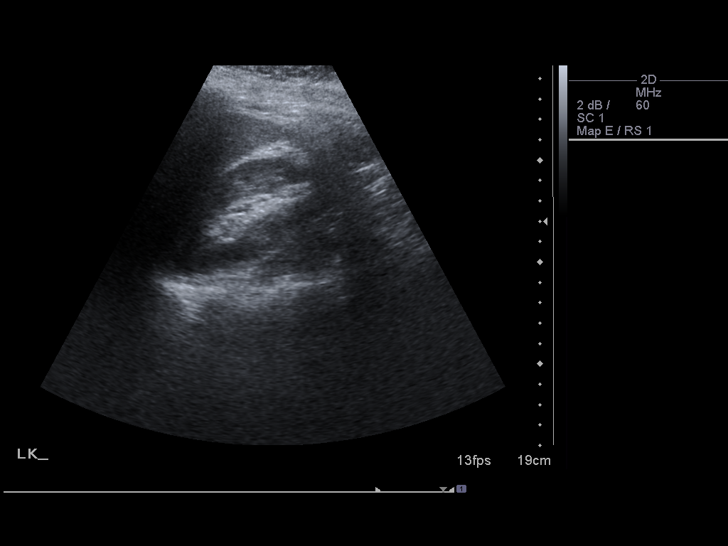

[14 of 25 positions shown; findings below may reference images not displayed]

FINDINGS: Right Kidney:  Normal, measuring 11.2 cm in length. No
hydronephrosis.

Left Kidney:  Normal, measuring 10.7 cm in length.  No
hydronephrosis.

Bladder:  Poorly distended, grossly normal.
IMPRESSION: Normal examination.

## 2012-05-20 ENCOUNTER — Ambulatory Visit (INDEPENDENT_AMBULATORY_CARE_PROVIDER_SITE_OTHER): Payer: BC Managed Care – PPO | Admitting: Family Medicine

## 2012-05-20 ENCOUNTER — Encounter: Payer: Self-pay | Admitting: Family Medicine

## 2012-05-20 VITALS — BP 118/78 | HR 81 | Temp 98.9°F | Ht 66.25 in | Wt 222.6 lb

## 2012-05-20 DIAGNOSIS — M545 Low back pain: Secondary | ICD-10-CM

## 2012-05-20 DIAGNOSIS — G8929 Other chronic pain: Secondary | ICD-10-CM

## 2012-05-20 DIAGNOSIS — K5190 Ulcerative colitis, unspecified, without complications: Secondary | ICD-10-CM | POA: Insufficient documentation

## 2012-05-20 MED ORDER — METHOCARBAMOL 500 MG PO TABS: 500.0000 mg | ORAL_TABLET | Freq: Three times a day (TID) | ORAL | Status: DC

## 2012-05-20 MED ORDER — HYDROMORPHONE HCL 4 MG PO TABS: 4.0000 mg | ORAL_TABLET | Freq: Three times a day (TID) | ORAL | Status: DC | PRN

## 2012-05-20 MED ORDER — GABAPENTIN 300 MG PO CAPS: 300.0000 mg | ORAL_CAPSULE | Freq: Three times a day (TID) | ORAL | Status: DC

## 2012-05-20 NOTE — Patient Instructions (Addendum)
Follow up in 1 month We'll notify you of your MRI appt and your Neurosurgery appt Start the Neurontin nightly x1 week, then increase to twice daily, then increase to 3x/day Use the Robaxin for pain and spasm Use Dilaudid only for severe pain Call with any questions or concerns Happy [employer] Birthday! Hang in there!

## 2012-05-20 NOTE — Progress Notes (Signed)
  Subjective:    Patient ID: Crystal Hatfield, female    DOB: [birth year], 32 y.o.   MRN: [id number]  HPI New to establish.  Previous MD- Baird Cancer.  Chronic back pain- was told by Dr Baird Cancer that she doesn't treat chronic pain.  Was referred to [facility] in January but has not heard anything- called Dr Baird Cancer office and pain management and no one has told her the status of referral.  Is currently in physical therapy.  Was told by GI at [facility] that she has arthritis in spine.  Now has job and Insurance underwriter.  Pt was at [employer] on track scholarship (shot, javelin, discuss).  No known injury.  Pain has been constant x 'several years'.  Pain is bandlike across lower back and L flank.  Is having intermittent numbness.  Legs are subjectively weak in comparison to previous level of activity.  Painful to sit, stand.  Has not had MRI despite 3 yrs of pain.  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  UC- dx'd at age 76.  Seeing Dr Collene Mares.  On Mesalamine.   Review of Systems For ROS see HPI     Objective:   Physical Exam  Vitals reviewed. Constitutional: She is oriented to person, place, and time. She appears well-developed and well-nourished. No distress.  overweight  Neck: Normal range of motion. Neck supple.  Cardiovascular: Normal rate, regular rhythm and normal heart sounds.   Pulmonary/Chest: Effort normal and breath sounds normal. No respiratory distress. She has no wheezes. She has no rales.  Musculoskeletal: She exhibits tenderness (over lumbar paraspinals bilaterally). She exhibits no edema.  + TTP over lumbar paraspinals Pain w/ extension>flexion (-) SLR bilaterally  Neurological: She is alert and oriented to person, place, and time. She has normal reflexes. No cranial nerve deficit. Coordination normal.  Skin: Skin is warm and dry.  Psychiatric: She has a normal mood and affect. Her behavior is normal. Thought content normal.          Assessment & Plan:

## 2012-05-21 NOTE — Assessment & Plan Note (Signed)
New to provider, ongoing for pt.  Following regularly w/ GI.  On Mesalamine.  Will follow along and assist as able.

## 2012-05-21 NOTE — Assessment & Plan Note (Signed)
New to provider, ongoing for pt.  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  Encouraged use of NSAIDs and muscle relaxers.  Will get MRI as pt has not had one done.  Will also refer to neuro surg as pt needs to determine whether something can be done rather than start seeing a pain clinic at age 32.  Will fill pain meds on short term basis until pt can see neurosurg and determine next steps- which may include pain management.  Contract signed and UDS obtained.  Pt expressed understanding and is in agreement w/ plan.

## 2012-05-27 ENCOUNTER — Other Ambulatory Visit (HOSPITAL_COMMUNITY): Payer: BC Managed Care – PPO

## 2012-05-27 ENCOUNTER — Ambulatory Visit (HOSPITAL_COMMUNITY)
Admission: RE | Admit: 2012-05-27 | Discharge: 2012-05-27 | Disposition: A | Discharge status: Home / Self Care | Payer: BC Managed Care – PPO | Source: Ambulatory Visit | Attending: Family Medicine | Admitting: Family Medicine

## 2012-05-27 DIAGNOSIS — Q142 Congenital malformation of optic disc: Secondary | ICD-10-CM | POA: Insufficient documentation

## 2012-05-27 DIAGNOSIS — M545 Low back pain, unspecified: Secondary | ICD-10-CM | POA: Insufficient documentation

## 2012-05-27 DIAGNOSIS — R209 Unspecified disturbances of skin sensation: Secondary | ICD-10-CM | POA: Insufficient documentation

## 2012-05-27 DIAGNOSIS — R29898 Other symptoms and signs involving the musculoskeletal system: Secondary | ICD-10-CM | POA: Insufficient documentation

## 2012-05-27 DIAGNOSIS — M5137 Other intervertebral disc degeneration, lumbosacral region: Secondary | ICD-10-CM | POA: Insufficient documentation

## 2012-05-27 DIAGNOSIS — M5144 Schmorl's nodes, thoracic region: Secondary | ICD-10-CM | POA: Insufficient documentation

## 2012-05-27 DIAGNOSIS — M51379 Other intervertebral disc degeneration, lumbosacral region without mention of lumbar back pain or lower extremity pain: Secondary | ICD-10-CM | POA: Insufficient documentation

## 2012-05-27 DIAGNOSIS — G8929 Other chronic pain: Secondary | ICD-10-CM | POA: Insufficient documentation

## 2012-06-14 ENCOUNTER — Encounter: Payer: Self-pay | Admitting: Family Medicine

## 2012-06-17 ENCOUNTER — Encounter: Payer: Self-pay | Admitting: Family Medicine

## 2012-06-17 ENCOUNTER — Ambulatory Visit (INDEPENDENT_AMBULATORY_CARE_PROVIDER_SITE_OTHER): Payer: BC Managed Care – PPO | Admitting: Family Medicine

## 2012-06-17 VITALS — BP 110/80 | HR 98 | Temp 98.8°F | Ht 66.25 in | Wt 224.8 lb

## 2012-06-17 DIAGNOSIS — M2550 Pain in unspecified joint: Secondary | ICD-10-CM | POA: Insufficient documentation

## 2012-06-17 DIAGNOSIS — G8929 Other chronic pain: Secondary | ICD-10-CM

## 2012-06-17 DIAGNOSIS — M545 Low back pain: Secondary | ICD-10-CM

## 2012-06-17 MED ORDER — DULOXETINE HCL 30 MG PO CPEP
30.0000 mg | ORAL_CAPSULE | Freq: Every day | ORAL | Status: DC
Start: 2012-06-17 — End: 2016-05-07

## 2012-06-17 MED ORDER — HYDROMORPHONE HCL 4 MG PO TABS: 4.0000 mg | ORAL_TABLET | Freq: Three times a day (TID) | ORAL | Status: DC | PRN

## 2012-06-17 NOTE — Patient Instructions (Addendum)
Start the Cymbalta daily for pain/possible fibromyalgia Use the Dilaudid sparingly- this is only for severe pain Continue the Zanaflex as needed Someone will call you with your Rheumatology appt Call with any questions or concerns Hang in there!

## 2012-06-17 NOTE — Progress Notes (Signed)
  Subjective:    Patient ID: Crystal Hatfield, female    DOB: 1980-04-20, 32 y.o.   MRN: [id number]  HPI Back pain- pt was seen by [facility] and didn't feel that findings on MRI were overly concerning.  It was thought that pt may have fibro or other muscular or connective tissue disorder.  Plan is now to taper off Gabapentin to start Lyrica- waiting on prior auth.  Stopped Robaxin and started on Zanaflex.  Not currently on pain meds, 'nothing has changed'.  Recommended Rheum evaluation.  Pt reports that pain is still mostly L lumbar but admits to having pain 'all over'.  Pain over upper back, deep in R sacrum.  Will have pain in arms and legs- particularly in legs w/ walking.  Out of pain meds, [facility] not prescribing meds at this time.   Review of Systems For ROS see HPI     Objective:   Physical Exam  Vitals reviewed. Constitutional: She is oriented to person, place, and time. She appears well-developed and well-nourished. No distress.  Musculoskeletal: She exhibits tenderness (scattered over multiple trigger points). She exhibits no edema.  Neurological: She is alert and oriented to person, place, and time. No cranial nerve deficit. Coordination normal.  Skin: Skin is warm and dry.  Psychiatric: She has a normal mood and affect. Her behavior is normal.          Assessment & Plan:

## 2012-06-19 NOTE — Assessment & Plan Note (Signed)
New.  ? Of fibromyalgia.  Start Cymbalta to see if pain improves.  Refer for rheum evaluation.  Will follow.

## 2012-06-19 NOTE — Assessment & Plan Note (Signed)
Unchanged.  Has [facility]xn upcoming w/ [facility].  Out of meds- discussed + THC on drug screen.  Pt admits to heavy marijuana use in the past but states she has not smoked since her 77th birthday.  Told her that she would not get additional pain meds if she again tested +.  If the spine injxn does not provide relief, she will need pain management referral.

## 2012-06-23 ENCOUNTER — Telehealth: Payer: Self-pay | Admitting: Family Medicine

## 2012-06-23 NOTE — Telephone Encounter (Signed)
Needs to call Neurosurgeon for advice since they are in midst of workup

## 2012-06-23 NOTE — Telephone Encounter (Signed)
Pt was called back and she stated that since 3am she has been in so much pain she was not able to get out of bed. States that when the pain meds wear off she is in such severe pain she cannot move. Pt was crying on the phone complaining of this deep sciatic pain on the left leg that radiates down under kneecap. Please advise.

## 2012-06-23 NOTE — Telephone Encounter (Signed)
Pt called about a pinch nerve she is having and wanted advice from dr Birdie Riddle. Patient also wanted to know is there a type of exercise she could be doing to prevent the pinch nerve from happening located near her butt or should she schedule an appt with [place].

## 2012-06-24 NOTE — Telephone Encounter (Signed)
Spoke with the pt on (06-23-12) and informed her of Dr. Virgil Benedict recommendation below.  Pt stated that she was not seen by Neurosurgeon but was seen by [facility].  Informed the pt that I spoke with Dr. Birdie Riddle and she recommended that she called the [facility] b/c they are doing the workup.  Pt agreed.//AB/CMA

## 2012-06-25 ENCOUNTER — Telehealth: Payer: Self-pay | Admitting: General Practice

## 2012-06-25 NOTE — Telephone Encounter (Signed)
Pt called wanting to know why she had two missed calls from our office. She was notified that it was pertaining to the Rheumatology referral. Appt has been made by pt with their office.

## 2012-07-14 ENCOUNTER — Other Ambulatory Visit: Payer: Self-pay | Admitting: Internal Medicine

## 2012-07-14 DIAGNOSIS — M533 Sacrococcygeal disorders, not elsewhere classified: Secondary | ICD-10-CM

## 2012-07-23 ENCOUNTER — Ambulatory Visit
Admission: RE | Admit: 2012-07-23 | Discharge: 2012-07-23 | Disposition: A | Discharge status: Home / Self Care | Payer: BC Managed Care – PPO | Source: Ambulatory Visit | Attending: Internal Medicine | Admitting: Internal Medicine

## 2012-07-23 VITALS — BP 120/77 | HR 71

## 2012-07-23 DIAGNOSIS — M533 Sacrococcygeal disorders, not elsewhere classified: Secondary | ICD-10-CM

## 2012-07-23 MED ORDER — METHYLPREDNISOLONE ACETATE 40 MG/ML INJ SUSP (RADIOLOG
120.0000 mg | Freq: Once | INTRAMUSCULAR | Status: AC
  Administered 2012-07-23: 120 mg via INTRA_ARTICULAR

## 2012-07-23 MED ORDER — IOHEXOL 180 MG/ML  SOLN
1.0000 mL | Freq: Once | INTRAMUSCULAR | Status: AC | PRN
  Administered 2012-07-23: 1 mL via INTRA_ARTICULAR

## 2014-10-26 ENCOUNTER — Other Ambulatory Visit (HOSPITAL_COMMUNITY)
Admission: RE | Admit: 2014-10-26 | Discharge: 2014-10-26 | Disposition: A | Discharge status: Home / Self Care | Payer: 59 | Source: Ambulatory Visit | Attending: Family Medicine | Admitting: Family Medicine

## 2014-10-26 DIAGNOSIS — Z1151 Encounter for screening for human papillomavirus (HPV): Secondary | ICD-10-CM | POA: Diagnosis present

## 2014-10-26 DIAGNOSIS — Z01419 Encounter for gynecological examination (general) (routine) without abnormal findings: Secondary | ICD-10-CM | POA: Diagnosis present

## 2014-11-15 ENCOUNTER — Other Ambulatory Visit: Payer: Self-pay | Admitting: Orthopaedic Surgery

## 2014-11-15 DIAGNOSIS — M47896 Other spondylosis, lumbar region: Secondary | ICD-10-CM

## 2014-11-15 DIAGNOSIS — M546 Pain in thoracic spine: Secondary | ICD-10-CM

## 2014-11-15 DIAGNOSIS — M47816 Spondylosis without myelopathy or radiculopathy, lumbar region: Secondary | ICD-10-CM

## 2014-11-23 ENCOUNTER — Other Ambulatory Visit: Payer: Self-pay | Admitting: Orthopaedic Surgery

## 2014-11-23 DIAGNOSIS — M47816 Spondylosis without myelopathy or radiculopathy, lumbar region: Secondary | ICD-10-CM

## 2014-11-23 DIAGNOSIS — M47896 Other spondylosis, lumbar region: Secondary | ICD-10-CM

## 2014-11-23 DIAGNOSIS — M546 Pain in thoracic spine: Secondary | ICD-10-CM

## 2014-11-25 ENCOUNTER — Other Ambulatory Visit: Payer: 59

## 2014-11-27 ENCOUNTER — Ambulatory Visit
Admission: RE | Admit: 2014-11-27 | Discharge: 2014-11-27 | Disposition: A | Discharge status: Home / Self Care | Payer: 59 | Source: Ambulatory Visit | Attending: Orthopaedic Surgery | Admitting: Orthopaedic Surgery

## 2014-11-27 ENCOUNTER — Other Ambulatory Visit: Payer: 59

## 2014-11-27 DIAGNOSIS — M546 Pain in thoracic spine: Secondary | ICD-10-CM

## 2014-11-27 DIAGNOSIS — M47896 Other spondylosis, lumbar region: Secondary | ICD-10-CM

## 2014-11-27 DIAGNOSIS — M47816 Spondylosis without myelopathy or radiculopathy, lumbar region: Secondary | ICD-10-CM

## 2015-12-15 ENCOUNTER — Emergency Department (HOSPITAL_COMMUNITY)
Admission: EM | Admit: 2015-12-15 | Discharge: 2015-12-15 | Disposition: A | Discharge status: Home / Self Care | Payer: BLUE CROSS/BLUE SHIELD | Attending: Emergency Medicine | Admitting: Emergency Medicine

## 2015-12-15 ENCOUNTER — Encounter (HOSPITAL_COMMUNITY): Payer: Self-pay | Admitting: Emergency Medicine

## 2015-12-15 DIAGNOSIS — S01112A Laceration without foreign body of left eyelid and periocular area, initial encounter: Secondary | ICD-10-CM | POA: Diagnosis present

## 2015-12-15 DIAGNOSIS — W228XXA Striking against or struck by other objects, initial encounter: Secondary | ICD-10-CM | POA: Insufficient documentation

## 2015-12-15 DIAGNOSIS — Y929 Unspecified place or not applicable: Secondary | ICD-10-CM | POA: Insufficient documentation

## 2015-12-15 DIAGNOSIS — Y999 Unspecified external cause status: Secondary | ICD-10-CM | POA: Insufficient documentation

## 2015-12-15 DIAGNOSIS — Y939 Activity, unspecified: Secondary | ICD-10-CM | POA: Diagnosis not present

## 2015-12-15 DIAGNOSIS — Z23 Encounter for immunization: Secondary | ICD-10-CM | POA: Insufficient documentation

## 2015-12-15 DIAGNOSIS — S0181XA Laceration without foreign body of other part of head, initial encounter: Secondary | ICD-10-CM

## 2015-12-15 MED ORDER — TETANUS-DIPHTH-ACELL PERTUSSIS 5-2.5-18.5 LF-MCG/0.5 IM SUSP
0.5000 mL | Freq: Once | INTRAMUSCULAR | Status: AC
  Administered 2015-12-15: 0.5 mL via INTRAMUSCULAR
  Filled 2015-12-15: qty 0.5

## 2015-12-15 NOTE — Discharge Instructions (Signed)
Return here as needed.  Follow-up with your primary care doctor in the area does not need be cleaned do not scrub over the area.  The Dermabond will come off on its own
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
## 2015-12-15 NOTE — ED Notes (Signed)
Declined W/C at D/C and was escorted to lobby by RN. 

## 2016-05-07 ENCOUNTER — Emergency Department (HOSPITAL_COMMUNITY)
Admission: EM | Admit: 2016-05-07 | Discharge: 2016-05-08 | Disposition: A | Discharge status: Home / Self Care | Payer: 59 | Attending: Emergency Medicine | Admitting: Emergency Medicine

## 2016-05-07 ENCOUNTER — Encounter (HOSPITAL_COMMUNITY): Payer: Self-pay | Admitting: Emergency Medicine

## 2016-05-07 ENCOUNTER — Emergency Department (HOSPITAL_COMMUNITY): Payer: 59

## 2016-05-07 DIAGNOSIS — R42 Dizziness and giddiness: Secondary | ICD-10-CM | POA: Insufficient documentation

## 2016-05-07 DIAGNOSIS — R0602 Shortness of breath: Secondary | ICD-10-CM | POA: Diagnosis present

## 2016-05-07 DIAGNOSIS — R091 Pleurisy: Secondary | ICD-10-CM | POA: Insufficient documentation

## 2016-05-07 LAB — CBC
HCT: 39.2 % (ref 36.0–46.0)
Hemoglobin: 12.8 g/dL (ref 12.0–15.0)
MCH: 29.1 pg (ref 26.0–34.0)
MCHC: 32.7 g/dL (ref 30.0–36.0)
MCV: 89.1 fL (ref 78.0–100.0)
PLATELETS: 268 10*3/uL (ref 150–400)
RBC: 4.4 MIL/uL (ref 3.87–5.11)
RDW: 13.7 % (ref 11.5–15.5)
WBC: 12.5 10*3/uL — AB (ref 4.0–10.5)

## 2016-05-07 LAB — BASIC METABOLIC PANEL
Anion gap: 6 (ref 5–15)
BUN: 11 mg/dL (ref 6–20)
CALCIUM: 9.2 mg/dL (ref 8.9–10.3)
CO2: 25 mmol/L (ref 22–32)
CREATININE: 0.78 mg/dL (ref 0.44–1.00)
Chloride: 109 mmol/L (ref 101–111)
GFR calc Af Amer: >60 mL/min (ref >60)
GFR calc non Af Amer: >60 mL/min (ref >60)
GLUCOSE: 80 mg/dL (ref 65–99)
Potassium: 3.7 mmol/L (ref 3.5–5.1)
SODIUM: 140 mmol/L (ref 135–145)

## 2016-05-07 LAB — I-STAT TROPONIN, ED: Troponin i, poc: 0 ng/mL (ref 0.00–0.08)

## 2016-05-07 MED ORDER — MECLIZINE HCL 25 MG PO TABS
25.0000 mg | ORAL_TABLET | Freq: Once | ORAL | Status: AC
  Administered 2016-05-08: 25 mg via ORAL
  Filled 2016-05-07: qty 1

## 2016-05-07 NOTE — ED Provider Notes (Signed)
[facility] DEPT Provider Note   CSN: [id number] Arrival date & time: 05/07/16  1828   By signing my name below, I, Delton Prairie, attest that this documentation has been prepared under the direction and in the presence of Ripley Fraise, MD  Electronically Signed: Delton Prairie, ED Scribe. 05/07/16. 11:33 PM.   History   Chief Complaint Chief Complaint  Patient presents with  . Shortness of Breath    The history is provided by the patient. No language interpreter was used.  Dizziness  Severity:  Moderate Onset quality:  Unable to specify Timing:  Intermittent Progression:  Waxing and waning Chronicity:  New Relieved by:  Nothing Worsened by:  Turning head Associated symptoms: shortness of breath and tinnitus   Associated symptoms: no blood in stool, no headaches, no hearing loss, no vision changes and no vomiting   Shortness of breath:    Severity:  Mild   Onset quality:  Unable to specify   Timing:  Intermittent   Progression:  Unchanged Risk factors: no hx of stroke    HPI Comments:  Crystal Hatfield is a 36 y.o. female, with a PMHx of ulcerative colitis, chronic back pain and arthritis, who presents to the Emergency Department complaining of intermittent episodes of dizziness and lightheadedness onset 1 week ago. Her dizziness is worse with movement of her head. She also reports lip tingling, tinnitus, a dry cough, intermittent SOB, chest pain, back pain and bilateral upper extremity weakness. Her chest pain and back pain is worse with deep breathing and upon palpation. No alleviating factors noted. . Pt denies fevers, hemoptysis, vomiting, headaches, vision disturbances, hematochezia, vaginal bleeding, vaginal discharge, dysuria, LOC, any recent falls, denies a hx of blood clots, denies a hx of strokes, denies a hx of heart problems, any recent long distance travel. Pt notes she smokes marijuana.  No other drug use   Past Medical History:  Diagnosis Date  . Anemia      borderline per pt - no meds  . Anxiety    no meds  . Arthritis    knees and back, otc meds prn  . Back pain   . Chronic back pain   . Colitis   . Depression    no meds  . Headache(784.0)    history - OTC meds prn - last one 2 weeks    Patient Active Problem List   Diagnosis Date Noted  . Pain in joint, multiple sites 06/17/2012  . Chronic low back pain 05/20/2012  . Ulcerative colitis, unspecified 05/20/2012    Past Surgical History:  Procedure Laterality Date  . arthroscopic surgery     right knee  . COLONOSCOPY     x 3  . LAPAROSCOPY  04/11/2011   Procedure: LAPAROSCOPY OPERATIVE;  Surgeon: Cyril Mourning, MD;  Location: [facility];  Service: Gynecology;  Laterality: N/A;    OB History    No data available       Home Medications    Prior to Admission medications   Medication Sig Start Date End Date Taking? Authorizing Provider  DULoxetine (CYMBALTA) 30 MG capsule Take 1 capsule (30 mg total) by mouth daily. 06/17/12   Midge Minium, MD  gabapentin (NEURONTIN) 300 MG capsule Take 1 capsule (300 mg total) by mouth 3 (three) times daily. 05/20/12   Midge Minium, MD  HYDROmorphone (DILAUDID) 4 MG tablet Take 1 tablet (4 mg total) by mouth every 8 (eight) hours as needed. 06/17/12   Midge Minium, MD  ibuprofen (ADVIL,MOTRIN) 600 MG tablet Take 1 tablet (600 mg total) by mouth every 8 (eight) hours as needed for pain. 04/15/12   Jola Schmidt, MD  mesalamine (LIALDA) 1.2 G EC tablet Take 3,600 mg by mouth 2 (two) times daily.     Historical Provider, MD  tiZANidine (ZANAFLEX) 4 MG tablet Take 1 tablet by mouth at bedtime. 06/15/12   Historical Provider, MD    Family History Family History  Problem Relation Age of Onset  . Hypertension Mother   . Mental illness Mother   . Diabetes Maternal Grandmother   . Hypertension Maternal Grandmother   . Heart murmur Sister     Social History Social History  Substance Use Topics  . Smoking status: Never Smoker   . Smokeless tobacco: Never Used  . Alcohol use Yes     Comment: socially     Allergies   Ciprofloxacin and Sulfa antibiotics   Review of Systems Review of Systems  Constitutional: Negative for fever.  HENT: Positive for tinnitus. Negative for hearing loss.   Eyes: Negative for visual disturbance.  Respiratory: Positive for cough and shortness of breath.   Gastrointestinal: Negative for blood in stool and vomiting.  Genitourinary: Negative for dysuria, vaginal bleeding and vaginal discharge.  Musculoskeletal: Positive for back pain and myalgias. Negative for gait problem.  Neurological: Positive for dizziness and light-headedness. Negative for syncope and headaches.  All other systems reviewed and are negative.  Physical Exam Updated Vital Signs BP 134/79   Pulse 63   Temp 99.1 F (37.3 C)   Resp 18   Ht 5' 6"  (1.676 m)   Wt 170 lb (77.1 kg)   LMP 04/24/2016 (Within Days)   SpO2 100%   BMI 27.44 kg/m   Physical Exam CONSTITUTIONAL: Well developed/well nourished HEAD: Normocephalic/atraumatic EYES: EOMI/PERRL, no nystagmus,  no ptosis ENMT: Mucous membranes moist, bilateral TMs intact NECK: supple no meningeal signs, no bruits CV: S1/S2 noted, no murmurs/rubs/gallops noted LUNGS: Lungs are clear to auscultation bilaterally, no apparent distress CHEST: diffuse left sided chest wall tenderness  ABDOMEN: soft, nontender, no rebound or guarding GU:no cva tenderness NEURO:Awake/alert, face symmetric, no arm or leg drift is noted Equal 5/5 strength with shoulder abduction, elbow flex/extension, wrist flex/extension in upper extremities and equal hand grips bilaterally Equal 5/5 strength with hip flexion,knee flex/extension, foot dorsi/plantar flexion Cranial nerves 3/4/5/6/09/01/08/11/12 tested and intact Gait normal without ataxia No past pointing Sensation to light touch intact in all extremities EXTREMITIES: pulses normal, full ROM SKIN: warm, color normal PSYCH: no  abnormalities of mood noted   ED Treatments / Results  DIAGNOSTIC STUDIES:  Oxygen Saturation is 100% on RA, normal by my interpretation.    COORDINATION OF CARE:  11:28 PM Discussed treatment plan with pt at bedside and pt agreed to plan.  Labs (all labs ordered are listed, but only abnormal results are displayed) Labs Reviewed  CBC - Abnormal; Notable for the following:       Result Value   WBC 12.5 (*)    All other components within normal limits  URINALYSIS, ROUTINE W REFLEX MICROSCOPIC - Abnormal; Notable for the following:    APPearance HAZY (*)    Hgb urine dipstick SMALL (*)    Bacteria, UA MANY (*)    Squamous Epithelial / LPF 6-30 (*)    All other components within normal limits  BASIC METABOLIC PANEL  Randolm Idol, ED  POC URINE PREG, ED    EKG  EKG Interpretation  Date/Time:  Wednesday  May 07 2016 18:34:05 EDT Ventricular Rate:  76 PR Interval:  118 QRS Duration: 80 QT Interval:  364 QTC Calculation: 409 R Axis:   89 Text Interpretation:  Normal sinus rhythm Normal ECG When compared with ECG of 04/15/2012, No significant change was found Confirmed by [facility]  MD, DAVID ([id number]) on 05/07/2016 10:29:09 PM       Radiology Dg Chest 2 View  Result Date: 05/07/2016 CLINICAL DATA:  Dizziness EXAM: CHEST  2 VIEW COMPARISON:  None. FINDINGS: The heart size and mediastinal contours are within normal limits. Both lungs are clear. The visualized skeletal structures are unremarkable. IMPRESSION: No active cardiopulmonary disease. Electronically Signed   By: Donavan Foil M.D.   On: 05/07/2016 19:09    Procedures Procedures (including critical care time)  Medications Ordered in ED Medications  meclizine (ANTIVERT) tablet 25 mg (25 mg Oral Given 05/08/16 0004)  HYDROcodone-acetaminophen (NORCO/VICODIN) 5-325 MG per tablet 1 tablet (1 tablet Oral Given 05/08/16 0037)     Initial Impression / Assessment and Plan / ED Course  I have reviewed the triage vital  signs and the nursing notes.  Pertinent labs & imaging results that were available during my care of the patient were reviewed by me and considered in my medical decision making (see chart for details).     Pt stable No neuro deficits, I doubt stroke or other acute neurologic process If symptoms continue for next week, she will need repeat evaluation and may need neuroimaging guided by PCP - patient/family agreeable  For CP - it is clearly reproducible on exam, I doubt ACS/PE (she appears PERC negative)   Will d/c home Patient/family agreeable   Final Clinical Impressions(s) / ED Diagnoses   Final diagnoses:  Dizziness  Pleurisy    New Prescriptions New Prescriptions   No medications on file  I personally performed the services described in this documentation, which was scribed in my presence. The recorded information has been reviewed and is accurate.        Ripley Fraise, MD 05/08/16 [phone]

## 2016-05-07 NOTE — ED Triage Notes (Signed)
Pt states for the last week she has been dizzy off and on. Pt states the last 2 days she has had shortness of breath when walking around but is intermittent and comes and goes. Pt is breathing easily at this time, no acute distress. Lung sounds clear, airway intact. denies any chest pain.

## 2016-05-08 LAB — URINALYSIS, ROUTINE W REFLEX MICROSCOPIC
Bilirubin Urine: NEGATIVE
GLUCOSE, UA: NEGATIVE mg/dL
KETONES UR: NEGATIVE mg/dL
Leukocytes, UA: NEGATIVE
Nitrite: NEGATIVE
PH: 6 (ref 5.0–8.0)
PROTEIN: NEGATIVE mg/dL
Specific Gravity, Urine: 1.019 (ref 1.005–1.030)

## 2016-05-08 LAB — POC URINE PREG, ED: Preg Test, Ur: NEGATIVE

## 2016-05-08 MED ORDER — IBUPROFEN 400 MG PO TABS: 600.0000 mg | ORAL_TABLET | Freq: Once | ORAL | Status: DC

## 2016-05-08 MED ORDER — HYDROCODONE-ACETAMINOPHEN 5-325 MG PO TABS
1.0000 | ORAL_TABLET | Freq: Once | ORAL | Status: AC
  Administered 2016-05-08: 1 via ORAL
  Filled 2016-05-08: qty 1

## 2016-05-08 MED ORDER — MECLIZINE HCL 25 MG PO TABS
25.0000 mg | ORAL_TABLET | Freq: Three times a day (TID) | ORAL | 0 refills | Status: DC | PRN
Start: 2016-05-08 — End: 2018-04-15

## 2016-05-08 NOTE — ED Notes (Signed)
Pt reports losing approx 30 lbs unintentionally in the last couple of months.

## 2016-05-08 NOTE — ED Notes (Signed)
Pt departed in NAD.  

## 2017-04-29 ENCOUNTER — Emergency Department (HOSPITAL_COMMUNITY)
Admission: EM | Admit: 2017-04-29 | Discharge: 2017-04-29 | Disposition: A | Discharge status: Home / Self Care | Payer: BLUE CROSS/BLUE SHIELD | Attending: Emergency Medicine | Admitting: Emergency Medicine

## 2017-04-29 ENCOUNTER — Encounter (HOSPITAL_COMMUNITY): Payer: Self-pay | Admitting: Emergency Medicine

## 2017-04-29 DIAGNOSIS — M62838 Other muscle spasm: Secondary | ICD-10-CM | POA: Insufficient documentation

## 2017-04-29 DIAGNOSIS — Z79899 Other long term (current) drug therapy: Secondary | ICD-10-CM | POA: Insufficient documentation

## 2017-04-29 MED ORDER — PREDNISONE 10 MG PO TABS: ORAL_TABLET | ORAL | 0 refills | Status: DC

## 2017-04-29 MED ORDER — PREDNISONE 20 MG PO TABS
60.0000 mg | ORAL_TABLET | Freq: Once | ORAL | Status: AC
  Administered 2017-04-29: 60 mg via ORAL
  Filled 2017-04-29: qty 3

## 2017-04-29 MED ORDER — CYCLOBENZAPRINE HCL 10 MG PO TABS
10.0000 mg | ORAL_TABLET | Freq: Once | ORAL | Status: AC
  Administered 2017-04-29: 10 mg via ORAL
  Filled 2017-04-29: qty 1

## 2017-04-29 MED ORDER — CYCLOBENZAPRINE HCL 10 MG PO TABS: 10.0000 mg | ORAL_TABLET | Freq: Two times a day (BID) | ORAL | 0 refills | Status: DC | PRN

## 2017-04-29 MED ORDER — HYDROCODONE-ACETAMINOPHEN 5-325 MG PO TABS
1.0000 | ORAL_TABLET | Freq: Once | ORAL | Status: AC
  Administered 2017-04-29: 1 via ORAL
  Filled 2017-04-29: qty 1

## 2017-04-29 NOTE — ED Provider Notes (Signed)
[facility] DEPT Provider Note   CSN: [id number] Arrival date & time: 04/29/17  2027     History   Chief Complaint Chief Complaint  Patient presents with  . Shoulder Pain  . Neck Pain    HPI Crystal Hatfield is a 37 y.o. female.  Patient presents for evaluation of severe right shoulder pain that started 2 days ago and has gotten progressively worse. No direct injury. She works in a [employer] and does heavy lifting but this is not new work for her. She describes sharp, stabbing pain that gets suddenly worsen with certain movements. No radiation of pain into the extremity. No weakness or numbness. Pain is localized to the right lower neck/upper back and goes to the parascapular area. No cough, SOB or pleuritic chest pain.   The history is provided by the patient. No language interpreter was used.    Past Medical History:  Diagnosis Date  . Anemia    borderline per pt - no meds  . Anxiety    no meds  . Arthritis    knees and back, otc meds prn  . Back pain   . Chronic back pain   . Colitis   . Depression    no meds  . Headache(784.0)    history - OTC meds prn - last one 2 weeks    Patient Active Problem List   Diagnosis Date Noted  . Pain in joint, multiple sites 06/17/2012  . Chronic low back pain 05/20/2012  . Ulcerative colitis, unspecified 05/20/2012    Past Surgical History:  Procedure Laterality Date  . arthroscopic surgery     right knee  . COLONOSCOPY     x 3  . LAPAROSCOPY  04/11/2011   Procedure: LAPAROSCOPY OPERATIVE;  Surgeon: Cyril Mourning, MD;  Location: [facility];  Service: Gynecology;  Laterality: N/A;    OB History    No data available       Home Medications    Prior to Admission medications   Medication Sig Start Date End Date Taking? Authorizing Provider  ibuprofen (ADVIL,MOTRIN) 600 MG tablet Take 1 tablet (600 mg total) by mouth every 8 (eight) hours as needed for pain. 04/15/12   Jola Schmidt, MD    meclizine (ANTIVERT) 25 MG tablet Take 1 tablet (25 mg total) by mouth 3 (three) times daily as needed for dizziness. 05/08/16   Ripley Fraise, MD  Multiple Vitamin (MULTIVITAMIN WITH MINERALS) TABS tablet Take 1 tablet by mouth daily.    [provider]  venlafaxine (EFFEXOR) 75 MG tablet Take 75 mg by mouth 3 (three) times daily.    [provider]    Family History Family History  Problem Relation Age of Onset  . Hypertension Mother   . Mental illness Mother   . Diabetes Maternal Grandmother   . Hypertension Maternal Grandmother   . Heart murmur Sister     Social History Social History   Tobacco Use  . Smoking status: Never Smoker  . Smokeless tobacco: Never Used  Substance Use Topics  . Alcohol use: Yes    Comment: socially  . Drug use: Yes    Types: Marijuana    Comment: last use today - 04/07/11     Allergies   Ciprofloxacin and Sulfa antibiotics   Review of Systems Review of Systems  Constitutional: Negative for chills and fever.  Respiratory: Negative.  Negative for cough and shortness of breath.   Cardiovascular: Negative.  Negative for chest pain.  Musculoskeletal:       See HPI.  Skin: Negative.   Neurological: Negative.  Negative for weakness, numbness and headaches.     Physical Exam Updated Vital Signs BP 115/85 (BP Location: Right Arm)   Pulse 93   Temp 98.8 F (37.1 C) (Oral)   Resp 18   LMP 04/27/2017   SpO2 99%   Physical Exam  Constitutional: She appears well-developed and well-nourished. No distress.  Musculoskeletal: Normal range of motion. She exhibits tenderness. She exhibits no edema or deformity.       Back:  No midline or paracervical tenderness. Grip strength in UE's is 5/5 and symmetric. FROM UE's bilaterally.  Skin: Skin is warm and dry.     ED Treatments / Results  Labs (all labs ordered are listed, but only abnormal results are displayed) Labs Reviewed - No data to display  EKG  EKG  Interpretation None       Radiology No results found.  Procedures Procedures (including critical care time)  Medications Ordered in ED Medications  HYDROcodone-acetaminophen (NORCO/VICODIN) 5-325 MG per tablet 1 tablet (not administered)  cyclobenzaprine (FLEXERIL) tablet 10 mg (not administered)  predniSONE (DELTASONE) tablet 60 mg (not administered)     Initial Impression / Assessment and Plan / ED Course  I have reviewed the triage vital signs and the nursing notes.  Pertinent labs & imaging results that were available during my care of the patient were reviewed by me and considered in my medical decision making (see chart for details).     Patient presents with pain in right shoulder/upper back with spastic type pattern. No swelling. No neurologic deficits.   Final Clinical Impressions(s) / ED Diagnoses   Final diagnoses:  None   1. Right shoulder pain 2. Muscle spasm  ED Discharge Orders    None       Charlann Lange, PA-C 04/30/17 0020    Tegeler, Gwenyth Allegra, MD 04/30/17 [phone]

## 2017-04-29 NOTE — ED Notes (Signed)
Bed: WTR5 Expected date:  Expected time:  Means of arrival:  Comments: 

## 2017-04-29 NOTE — ED Triage Notes (Signed)
Patient c/o right side neck and shoulder pain x2 weeks. Reports working in [employer]. States pain worsens with movement. Denies injury.

## 2017-09-01 ENCOUNTER — Emergency Department (HOSPITAL_COMMUNITY)
Admission: EM | Admit: 2017-09-01 | Discharge: 2017-09-01 | Disposition: A | Discharge status: Home / Self Care | Payer: Self-pay | Attending: Emergency Medicine | Admitting: Emergency Medicine

## 2017-09-01 ENCOUNTER — Other Ambulatory Visit: Payer: Self-pay

## 2017-09-01 ENCOUNTER — Encounter (HOSPITAL_COMMUNITY): Payer: Self-pay | Admitting: Emergency Medicine

## 2017-09-01 ENCOUNTER — Emergency Department (HOSPITAL_COMMUNITY): Payer: Self-pay

## 2017-09-01 DIAGNOSIS — R0789 Other chest pain: Secondary | ICD-10-CM | POA: Insufficient documentation

## 2017-09-01 DIAGNOSIS — Z79899 Other long term (current) drug therapy: Secondary | ICD-10-CM | POA: Insufficient documentation

## 2017-09-01 LAB — CBC
HCT: 44.2 % (ref 36.0–46.0)
Hemoglobin: 14 g/dL (ref 12.0–15.0)
MCH: 27.8 pg (ref 26.0–34.0)
MCHC: 31.7 g/dL (ref 30.0–36.0)
MCV: 87.7 fL (ref 78.0–100.0)
Platelets: 268 10*3/uL (ref 150–400)
RBC: 5.04 MIL/uL (ref 3.87–5.11)
RDW: 13 % (ref 11.5–15.5)
WBC: 10.2 10*3/uL (ref 4.0–10.5)

## 2017-09-01 LAB — I-STAT TROPONIN, ED: TROPONIN I, POC: 0 ng/mL (ref 0.00–0.08)

## 2017-09-01 LAB — BASIC METABOLIC PANEL
Anion gap: 8 (ref 5–15)
BUN: 10 mg/dL (ref 6–20)
CALCIUM: 9.2 mg/dL (ref 8.9–10.3)
CO2: 24 mmol/L (ref 22–32)
CREATININE: 0.88 mg/dL (ref 0.44–1.00)
Chloride: 106 mmol/L (ref 98–111)
GFR calc Af Amer: >60 mL/min (ref >60)
GFR calc non Af Amer: >60 mL/min (ref >60)
Glucose, Bld: 98 mg/dL (ref 70–99)
Potassium: 4.1 mmol/L (ref 3.5–5.1)
SODIUM: 138 mmol/L (ref 135–145)

## 2017-09-01 LAB — I-STAT BETA HCG BLOOD, ED (MC, WL, AP ONLY)

## 2017-09-01 MED ORDER — CYCLOBENZAPRINE HCL 10 MG PO TABS: 10.0000 mg | ORAL_TABLET | Freq: Two times a day (BID) | ORAL | 0 refills | Status: DC | PRN

## 2017-09-01 MED ORDER — KETOROLAC TROMETHAMINE 60 MG/2ML IM SOLN
30.0000 mg | Freq: Once | INTRAMUSCULAR | Status: AC
  Administered 2017-09-01: 30 mg via INTRAMUSCULAR
  Filled 2017-09-01: qty 2

## 2017-09-01 NOTE — ED Provider Notes (Signed)
[facility] EMERGENCY DEPARTMENT Provider Note   CSN: [id number] Arrival date & time: 09/01/17  1053     History   Chief Complaint Chief Complaint  Patient presents with  . Chest Pain    HPI Crystal Hatfield is a 37 y.o. female.  HPI   37 year old female presents to complaints of chest wall pain. Patient notes a 2-3 day history of pain in her left anterior chest wall, left shoulder. She notes pain is worse with movement, worse with palpation. She notes the pain is worse with movement of the shoulder. Patient denies any shortness of breath, cough, fever, she denies any epigastric or abdominal pain. No history of trauma. No cardiac or pulmonary history no history DVT or PE or any significant risk factors.  Past Medical History:  Diagnosis Date  . Anemia    borderline per pt - no meds  . Anxiety    no meds  . Arthritis    knees and back, otc meds prn  . Back pain   . Chronic back pain   . Colitis   . Depression    no meds  . Headache(784.0)    history - OTC meds prn - last one 2 weeks    Patient Active Problem List   Diagnosis Date Noted  . Pain in joint, multiple sites 06/17/2012  . Chronic low back pain 05/20/2012  . Ulcerative colitis, unspecified 05/20/2012    Past Surgical History:  Procedure Laterality Date  . arthroscopic surgery     right knee  . COLONOSCOPY     x 3  . LAPAROSCOPY  04/11/2011   Procedure: LAPAROSCOPY OPERATIVE;  Surgeon: Cyril Mourning, MD;  Location: [facility];  Service: Gynecology;  Laterality: N/A;     OB History   None      Home Medications    Prior to Admission medications   Medication Sig Start Date End Date Taking? Authorizing Provider  cyclobenzaprine (FLEXERIL) 10 MG tablet Take 1 tablet (10 mg total) by mouth 2 (two) times daily as needed for muscle spasms. 09/01/17   Ari Engelbrecht, Dellis Filbert, PA-C  ibuprofen (ADVIL,MOTRIN) 600 MG tablet Take 1 tablet (600 mg total) by mouth every 8 (eight) hours as needed for  pain. 04/15/12   Jola Schmidt, MD  meclizine (ANTIVERT) 25 MG tablet Take 1 tablet (25 mg total) by mouth 3 (three) times daily as needed for dizziness. 05/08/16   Ripley Fraise, MD  Multiple Vitamin (MULTIVITAMIN WITH MINERALS) TABS tablet Take 1 tablet by mouth daily.    [provider]  predniSONE (DELTASONE) 10 MG tablet Take 5 on day 2 (day one given in emergency dept.) Take 4 on day 3 Take 3 on day 4 Take 2 on day 5 Take 1 on day 6 04/29/17   Charlann Lange, PA-C  venlafaxine (EFFEXOR) 75 MG tablet Take 75 mg by mouth 3 (three) times daily.    [provider]    Family History Family History  Problem Relation Age of Onset  . Hypertension Mother   . Mental illness Mother   . Diabetes Maternal Grandmother   . Hypertension Maternal Grandmother   . Heart murmur Sister     Social History Social History   Tobacco Use  . Smoking status: Never Smoker  . Smokeless tobacco: Never Used  Substance Use Topics  . Alcohol use: Yes    Comment: socially  . Drug use: Yes    Types: Marijuana    Comment: last use today -  04/07/11    Allergies   Ciprofloxacin and Sulfa antibiotics   Review of Systems Review of Systems  All other systems reviewed and are negative.  Physical Exam Updated Vital Signs BP 119/78 (BP Location: Right Arm)   Pulse 83   Temp 98.9 F (37.2 C) (Oral)   Resp 20   Ht 5' 6"  (1.676 m)   Wt 104.3 kg (230 lb)   LMP 08/18/2017   SpO2 99%   BMI 37.12 kg/m   Physical Exam  Constitutional: She is oriented to person, place, and time. She appears well-developed and well-nourished.  HENT:  Head: Normocephalic and atraumatic.  Eyes: Pupils are equal, round, and reactive to light. Conjunctivae are normal. Right eye exhibits no discharge. Left eye exhibits no discharge. No scleral icterus.  Neck: Normal range of motion. No JVD present. No tracheal deviation present.  Cardiovascular: Normal rate, regular rhythm, normal heart sounds and intact  distal pulses. Exam reveals no gallop and no friction rub.  No murmur heard. Pulmonary/Chest: Effort normal and breath sounds normal. No stridor. No respiratory distress. She has no wheezes. She has no rales. She exhibits no tenderness.  Tenderness palpation of left anterior chest wall, left shoulder and upper extremity  Musculoskeletal:  Left upper extremity with minor tenderness at the proximal aspect, no pain to the C or T-spine region, no pain with axial compression of the cervical or thoracic spine, no loss of distal sensation strength or motor function  Neurological: She is alert and oriented to person, place, and time. Coordination normal.  Psychiatric: She has a normal mood and affect. Her behavior is normal. Judgment and thought content normal.  Nursing note and vitals reviewed.    ED Treatments / Results  Labs (all labs ordered are listed, but only abnormal results are displayed) Labs Reviewed  BASIC METABOLIC PANEL  CBC  I-STAT TROPONIN, ED  I-STAT BETA HCG BLOOD, ED ([facility], [facility], [facility] ONLY)    EKG EKG Interpretation  Date/Time:  Tuesday September 01 2017 10:55:29 EDT Ventricular Rate:  86 PR Interval:  128 QRS Duration: 82 QT Interval:  370 QTC Calculation: 442 R Axis:   86 Text Interpretation:  Normal sinus rhythm Right atrial enlargement Borderline ECG No significant change since last tracing Confirmed by Duffy Bruce [phone]) on 09/01/2017 3:15:35 PM   Radiology Dg Chest 2 View  Result Date: 09/01/2017 CLINICAL DATA:  Dull left-sided chest and arm pain at rest which becomes sharp with movement. Tingling in arms and hand at night which the patient from sleeping. Nonsmoker. EXAM: CHEST - 2 VIEW COMPARISON:  PA and lateral chest x-ray of May 07, 2016 FINDINGS: The lungs are adequately inflated and clear. The heart and pulmonary vascularity are normal. The mediastinum is normal in width. There is no pleural effusion. The bony thorax is unremarkable. IMPRESSION: There is no  pneumonia, CHF, nor other acute cardiopulmonary abnormality. Electronically Signed   By: David  Martinique M.D.   On: 09/01/2017 11:37    Procedures Procedures (including critical care time)  Medications Ordered in ED Medications  ketorolac (TORADOL) injection 30 mg (30 mg Intramuscular Given 09/01/17 1354)     Initial Impression / Assessment and Plan / ED Course  I have reviewed the triage vital signs and the nursing notes.  Pertinent labs & imaging results that were available during my care of the patient were reviewed by me and considered in my medical decision making (see chart for details).     Labs: I-STAT troponin, I-STAT beta-hCG, BMP,  CBC  Imaging: dg chest 2 view, EKG normal sinus rhythm  Consults:  Therapeutics:Toradol  Discharge Meds: Flexeril  Assessment/Plan: 37 year old female presents today with likely musculoskeletal pain. She has tenderness to palpation of the chest wall. Sure workup here, very low suspicion for ACS PE, infection or any other significant (etiology. Discharged home with symptomatic care, strict return precautions. She verbalized understanding and agreement to today's plan.    Final Clinical Impressions(s) / ED Diagnoses   Final diagnoses:  Chest wall pain    ED Discharge Orders        Ordered    cyclobenzaprine (FLEXERIL) 10 MG tablet  2 times daily PRN     09/01/17 1520       Okey Regal, PA-C 09/01/17 1526    Duffy Bruce, MD 09/02/17 [phone]

## 2017-09-01 NOTE — Discharge Instructions (Addendum)
Please read attached information. If you experience any new or worsening signs or symptoms please return to the emergency room for evaluation. Please follow-up with your primary care provider or specialist as discussed. Please use medication prescribed only as directed and discontinue taking if you have any concerning signs or symptoms.   °

## 2017-09-01 NOTE — ED Triage Notes (Signed)
Cp  X 2 days hurts to move , touch  It , and  Take a deep breath, hurts to move left arm

## 2018-03-11 ENCOUNTER — Other Ambulatory Visit: Payer: Self-pay

## 2018-03-11 ENCOUNTER — Encounter (HOSPITAL_COMMUNITY): Payer: Self-pay | Admitting: Emergency Medicine

## 2018-03-11 ENCOUNTER — Emergency Department (HOSPITAL_COMMUNITY)
Admission: EM | Admit: 2018-03-11 | Discharge: 2018-03-11 | Disposition: A | Discharge status: Home / Self Care | Payer: No Typology Code available for payment source | Attending: Emergency Medicine | Admitting: Emergency Medicine

## 2018-03-11 DIAGNOSIS — Z79899 Other long term (current) drug therapy: Secondary | ICD-10-CM | POA: Diagnosis not present

## 2018-03-11 DIAGNOSIS — Y999 Unspecified external cause status: Secondary | ICD-10-CM | POA: Diagnosis not present

## 2018-03-11 DIAGNOSIS — Y92199 Unspecified place in other specified residential institution as the place of occurrence of the external cause: Secondary | ICD-10-CM | POA: Insufficient documentation

## 2018-03-11 DIAGNOSIS — Y939 Activity, unspecified: Secondary | ICD-10-CM | POA: Diagnosis not present

## 2018-03-11 DIAGNOSIS — S161XXA Strain of muscle, fascia and tendon at neck level, initial encounter: Secondary | ICD-10-CM | POA: Diagnosis not present

## 2018-03-11 DIAGNOSIS — S199XXA Unspecified injury of neck, initial encounter: Secondary | ICD-10-CM | POA: Diagnosis present

## 2018-03-11 MED ORDER — KETOROLAC TROMETHAMINE 30 MG/ML IJ SOLN
15.0000 mg | Freq: Once | INTRAMUSCULAR | Status: AC
  Administered 2018-03-11: 15 mg via INTRAMUSCULAR
  Filled 2018-03-11: qty 1

## 2018-03-11 MED ORDER — METHOCARBAMOL 500 MG PO TABS: 500.0000 mg | ORAL_TABLET | Freq: Two times a day (BID) | ORAL | 0 refills | Status: AC

## 2018-03-11 NOTE — Discharge Instructions (Addendum)
You may take Tylenol 500 mg every 6 hours as needed for pain.  You were given a prescription for Robaxin which is a muscle relaxer.  You should not drive, work, or operate machinery while taking this medication as it can make you very drowsy.    Please follow up with your primary care provider within 5-7 days for re-evaluation of your symptoms. Please return to the emergency department for any new or worsening symptoms.
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
## 2018-03-20 ENCOUNTER — Other Ambulatory Visit: Payer: Self-pay

## 2018-03-20 ENCOUNTER — Encounter (HOSPITAL_COMMUNITY): Payer: Self-pay

## 2018-03-20 ENCOUNTER — Emergency Department (HOSPITAL_COMMUNITY): Payer: No Typology Code available for payment source

## 2018-03-20 ENCOUNTER — Emergency Department (HOSPITAL_COMMUNITY)
Admission: EM | Admit: 2018-03-20 | Discharge: 2018-03-20 | Disposition: A | Discharge status: Home / Self Care | Payer: No Typology Code available for payment source | Attending: Emergency Medicine | Admitting: Emergency Medicine

## 2018-03-20 DIAGNOSIS — M62838 Other muscle spasm: Secondary | ICD-10-CM | POA: Diagnosis not present

## 2018-03-20 DIAGNOSIS — M25511 Pain in right shoulder: Secondary | ICD-10-CM | POA: Diagnosis present

## 2018-03-20 DIAGNOSIS — F419 Anxiety disorder, unspecified: Secondary | ICD-10-CM | POA: Diagnosis not present

## 2018-03-20 DIAGNOSIS — Z79899 Other long term (current) drug therapy: Secondary | ICD-10-CM | POA: Diagnosis not present

## 2018-03-20 DIAGNOSIS — F329 Major depressive disorder, single episode, unspecified: Secondary | ICD-10-CM | POA: Diagnosis not present

## 2018-03-20 LAB — BASIC METABOLIC PANEL
Anion gap: 8 (ref 5–15)
BUN: 13 mg/dL (ref 6–20)
CALCIUM: 9 mg/dL (ref 8.9–10.3)
CO2: 22 mmol/L (ref 22–32)
CREATININE: 0.86 mg/dL (ref 0.44–1.00)
Chloride: 109 mmol/L (ref 98–111)
Glucose, Bld: 102 mg/dL — ABNORMAL HIGH (ref 70–99)
Potassium: 3.6 mmol/L (ref 3.5–5.1)
Sodium: 139 mmol/L (ref 135–145)

## 2018-03-20 LAB — POCT I-STAT TROPONIN I: TROPONIN I, POC: 0 ng/mL (ref 0.00–0.08)

## 2018-03-20 LAB — CBC
HCT: 40.7 % (ref 36.0–46.0)
Hemoglobin: 13.2 g/dL (ref 12.0–15.0)
MCH: 28.5 pg (ref 26.0–34.0)
MCHC: 32.4 g/dL (ref 30.0–36.0)
MCV: 87.9 fL (ref 80.0–100.0)
NRBC: 0 % (ref 0.0–0.2)
PLATELETS: 296 10*3/uL (ref 150–400)
RBC: 4.63 MIL/uL (ref 3.87–5.11)
RDW: 12.9 % (ref 11.5–15.5)
WBC: 10.6 10*3/uL — ABNORMAL HIGH (ref 4.0–10.5)

## 2018-03-20 LAB — I-STAT BETA HCG BLOOD, ED (NOT ORDERABLE): I-stat hCG, quantitative: <5 m[IU]/mL (ref <5)

## 2018-03-20 MED ORDER — SODIUM CHLORIDE 0.9% FLUSH: 3.0000 mL | Freq: Once | INTRAVENOUS | Status: DC

## 2018-03-20 MED ORDER — DICLOFENAC SODIUM 1 % TD GEL: 2.0000 g | Freq: Four times a day (QID) | TRANSDERMAL | 0 refills | Status: AC

## 2018-03-20 MED ORDER — CYCLOBENZAPRINE HCL 10 MG PO TABS: 10.0000 mg | ORAL_TABLET | Freq: Two times a day (BID) | ORAL | 0 refills | Status: DC | PRN

## 2018-03-20 NOTE — Discharge Instructions (Addendum)
Apply diclofenac gel to your shoulders/areas of pain, as prescribed for the next few days. Avoid overuse. Be mindful of your abdominal symptoms, discontinue use if you develop abdominal pain.  You can take Flexeril every 12 hours as needed for muscle spasm.  Be aware this medication can make you drowsy, do not drive or drink alcohol while taking. Apply heat to your areas of pain followed by gentle stretches and massage. It is important that you stay hydrated. You can continue taking Tylenol every 6 hours as needed for pain. Follow-up with your primary care provider.

## 2018-03-20 NOTE — ED Notes (Signed)
Blood draw was attempted but was unsuccessful.
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
## 2018-04-15 ENCOUNTER — Ambulatory Visit (INDEPENDENT_AMBULATORY_CARE_PROVIDER_SITE_OTHER): Payer: Self-pay | Admitting: Primary Care

## 2018-04-15 ENCOUNTER — Encounter (INDEPENDENT_AMBULATORY_CARE_PROVIDER_SITE_OTHER): Payer: Self-pay | Admitting: Primary Care

## 2018-04-15 ENCOUNTER — Other Ambulatory Visit: Payer: Self-pay

## 2018-04-15 DIAGNOSIS — Z7689 Persons encountering health services in other specified circumstances: Secondary | ICD-10-CM

## 2018-04-15 DIAGNOSIS — M549 Dorsalgia, unspecified: Secondary | ICD-10-CM

## 2018-04-15 DIAGNOSIS — K5100 Ulcerative (chronic) pancolitis without complications: Secondary | ICD-10-CM

## 2018-04-15 MED ORDER — TRAMADOL HCL 50 MG PO TABS: 50.0000 mg | ORAL_TABLET | Freq: Three times a day (TID) | ORAL | 0 refills | Status: DC | PRN

## 2018-04-15 NOTE — Patient Instructions (Signed)
Muscle Cramps and Spasms Muscle cramps and spasms are when muscles tighten by themselves. They usually get better within minutes. Muscle cramps are painful. They are usually stronger and last longer than muscle spasms. Muscle spasms may or may not be painful. They can last a few seconds or much longer. Cramps and spasms can affect any muscle, but they occur most often in the calf muscles of the leg. They are usually not caused by a serious problem. In many cases, the cause is not known. Some common causes include:  Doing more physical work or exercise than your body is ready for.  Using the muscles too much (overuse) by repeating certain movements too many times.  Staying in a certain position for a long time.  Playing a sport or doing an activity without preparing properly.  Using bad form or technique while playing a sport or doing an activity.  Not having enough water in your body (dehydration).  Injury.  Side effects of some medicines.  Low levels of the salts and minerals in your blood (electrolytes), such as low potassium or calcium. Follow these instructions at home: Managing pain and stiffness      Massage, stretch, and relax the muscle. Do this for many minutes at a time.  If told, put heat on tight or tense muscles as often as told by your doctor. Use the heat source that your doctor recommends, such as a moist heat pack or a heating pad. ? Place a towel between your skin and the heat source. ? Leave the heat on for 20-30 minutes. ? Remove the heat if your skin turns bright red. This is very important if you are not able to feel pain, heat, or cold. You may have a greater risk of getting burned.  If told, put ice on the affected area. This may help if you are sore or have pain after a cramp or spasm. ? Put ice in a plastic bag. ? Place a towel between your skin and the bag. ? Leave the ice on for 20 minutes, 2-3 times a day.  Try taking hot showers or baths to help  relax tight muscles. Eating and drinking  Drink enough fluid to keep your pee (urine) pale yellow.  Eat a healthy diet to help ensure that your muscles work well. This should include: ? Fruits and vegetables. ? Lean protein. ? Whole grains. ? Low-fat or nonfat dairy products. General instructions  If you are having cramps often, avoid intense exercise for several days.  Take over-the-counter and prescription medicines only as told by your doctor.  Watch for any changes in your symptoms.  Keep all follow-up visits as told by your doctor. This is important. Contact a doctor if:  Your cramps or spasms get worse or happen more often.  Your cramps or spasms do not get better with time. Summary  Muscle cramps and spasms are when muscles tighten by themselves. They usually get better within minutes.  Cramps and spasms occur most often in the calf muscles of the leg.  Massage, stretch, and relax the muscle. This may help the cramp or spasm go away.  Drink enough fluid to keep your pee (urine) pale yellow. This information is not intended to replace advice given to you by your health care provider. Make sure you discuss any questions you have with your health care provider. Document Released: 01/24/2008 Document Revised: 07/06/2017 Document Reviewed: 07/06/2017 Elsevier Interactive Patient Education  2019 [employer].
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
## 2018-04-17 IMAGING — DX DG CHEST 2V
2 series · 2 of 2 positions shown · non-contrast
Comparison: None.

CLINICAL DATA: Dizziness

EXAM:
CHEST  2 VIEW

[chest lat]
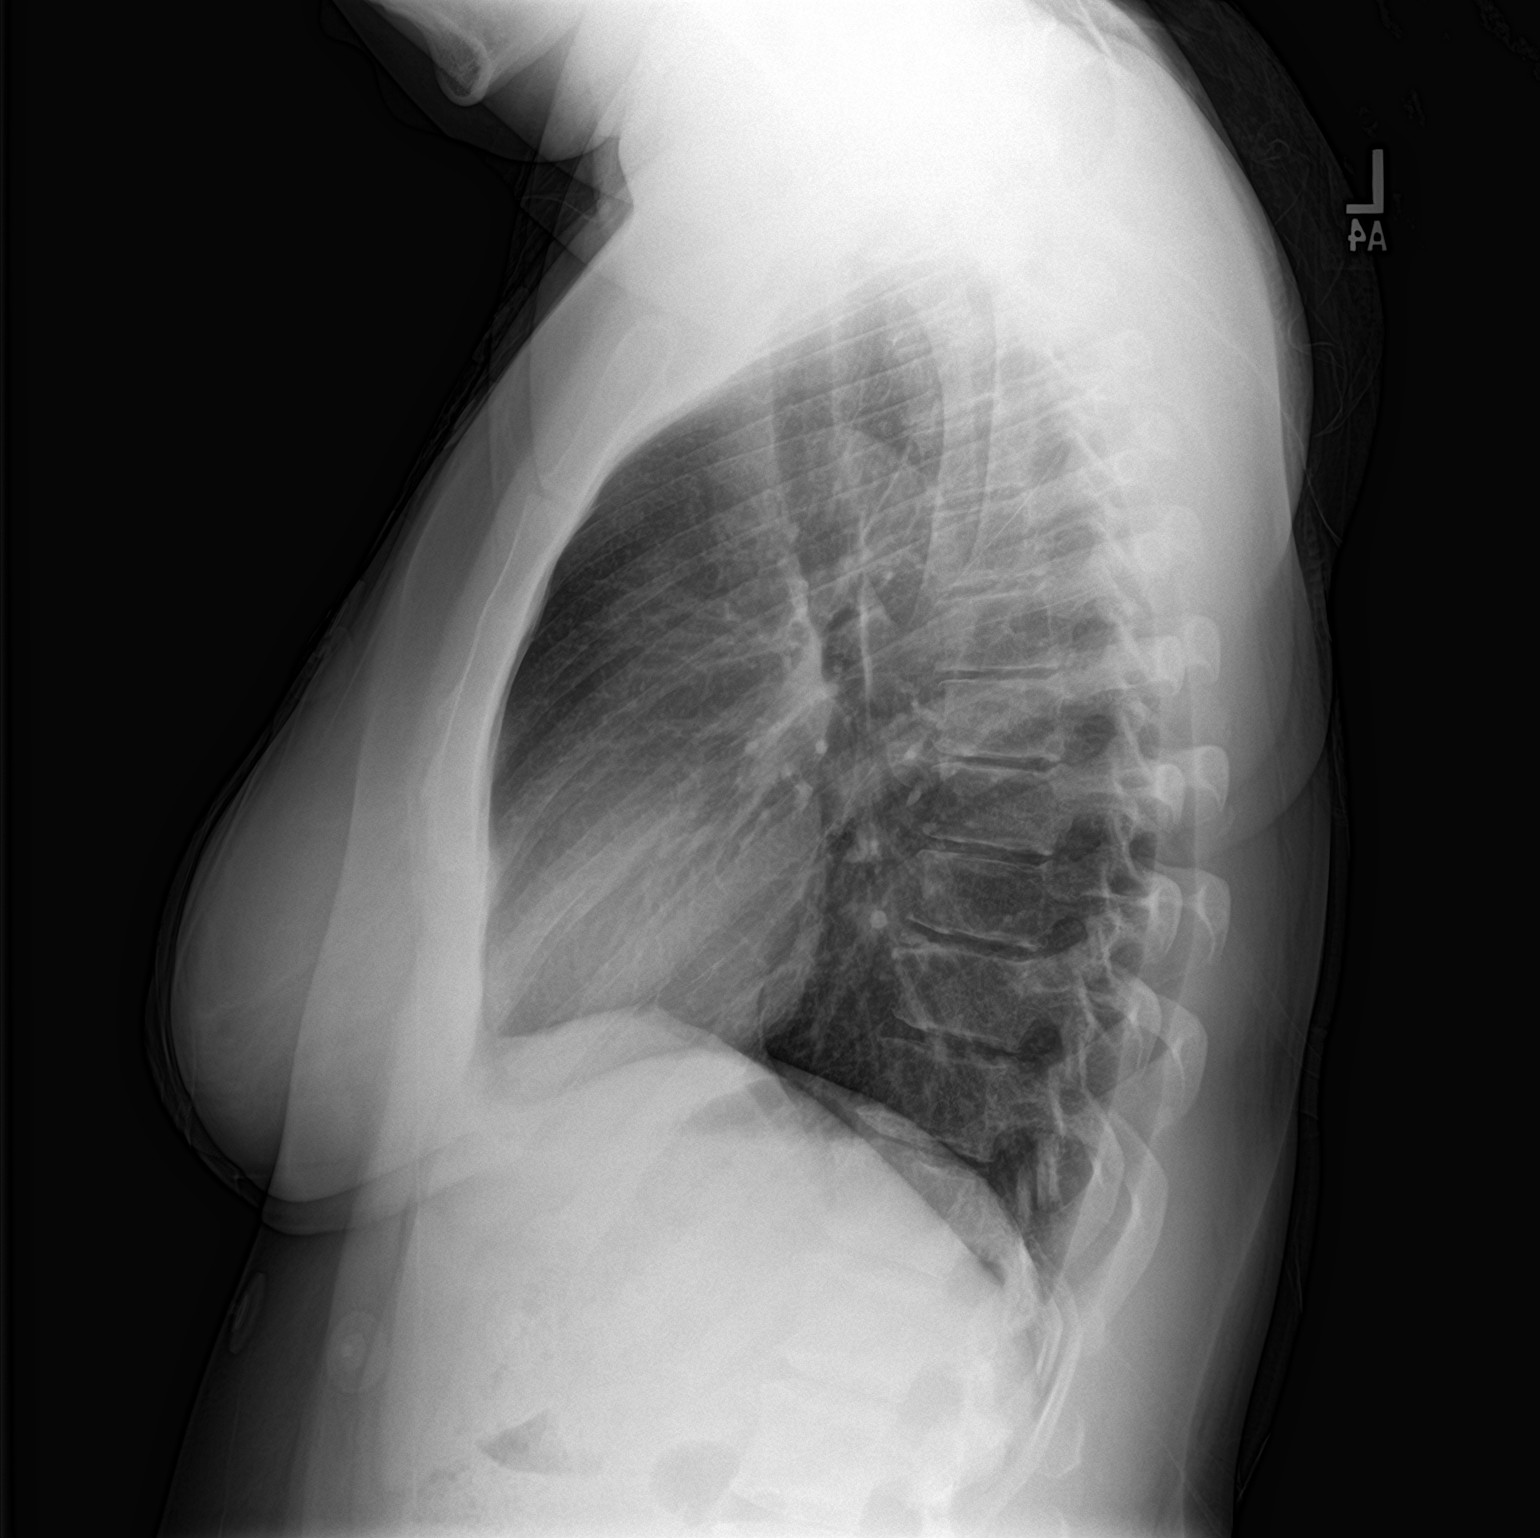

[chest pa]
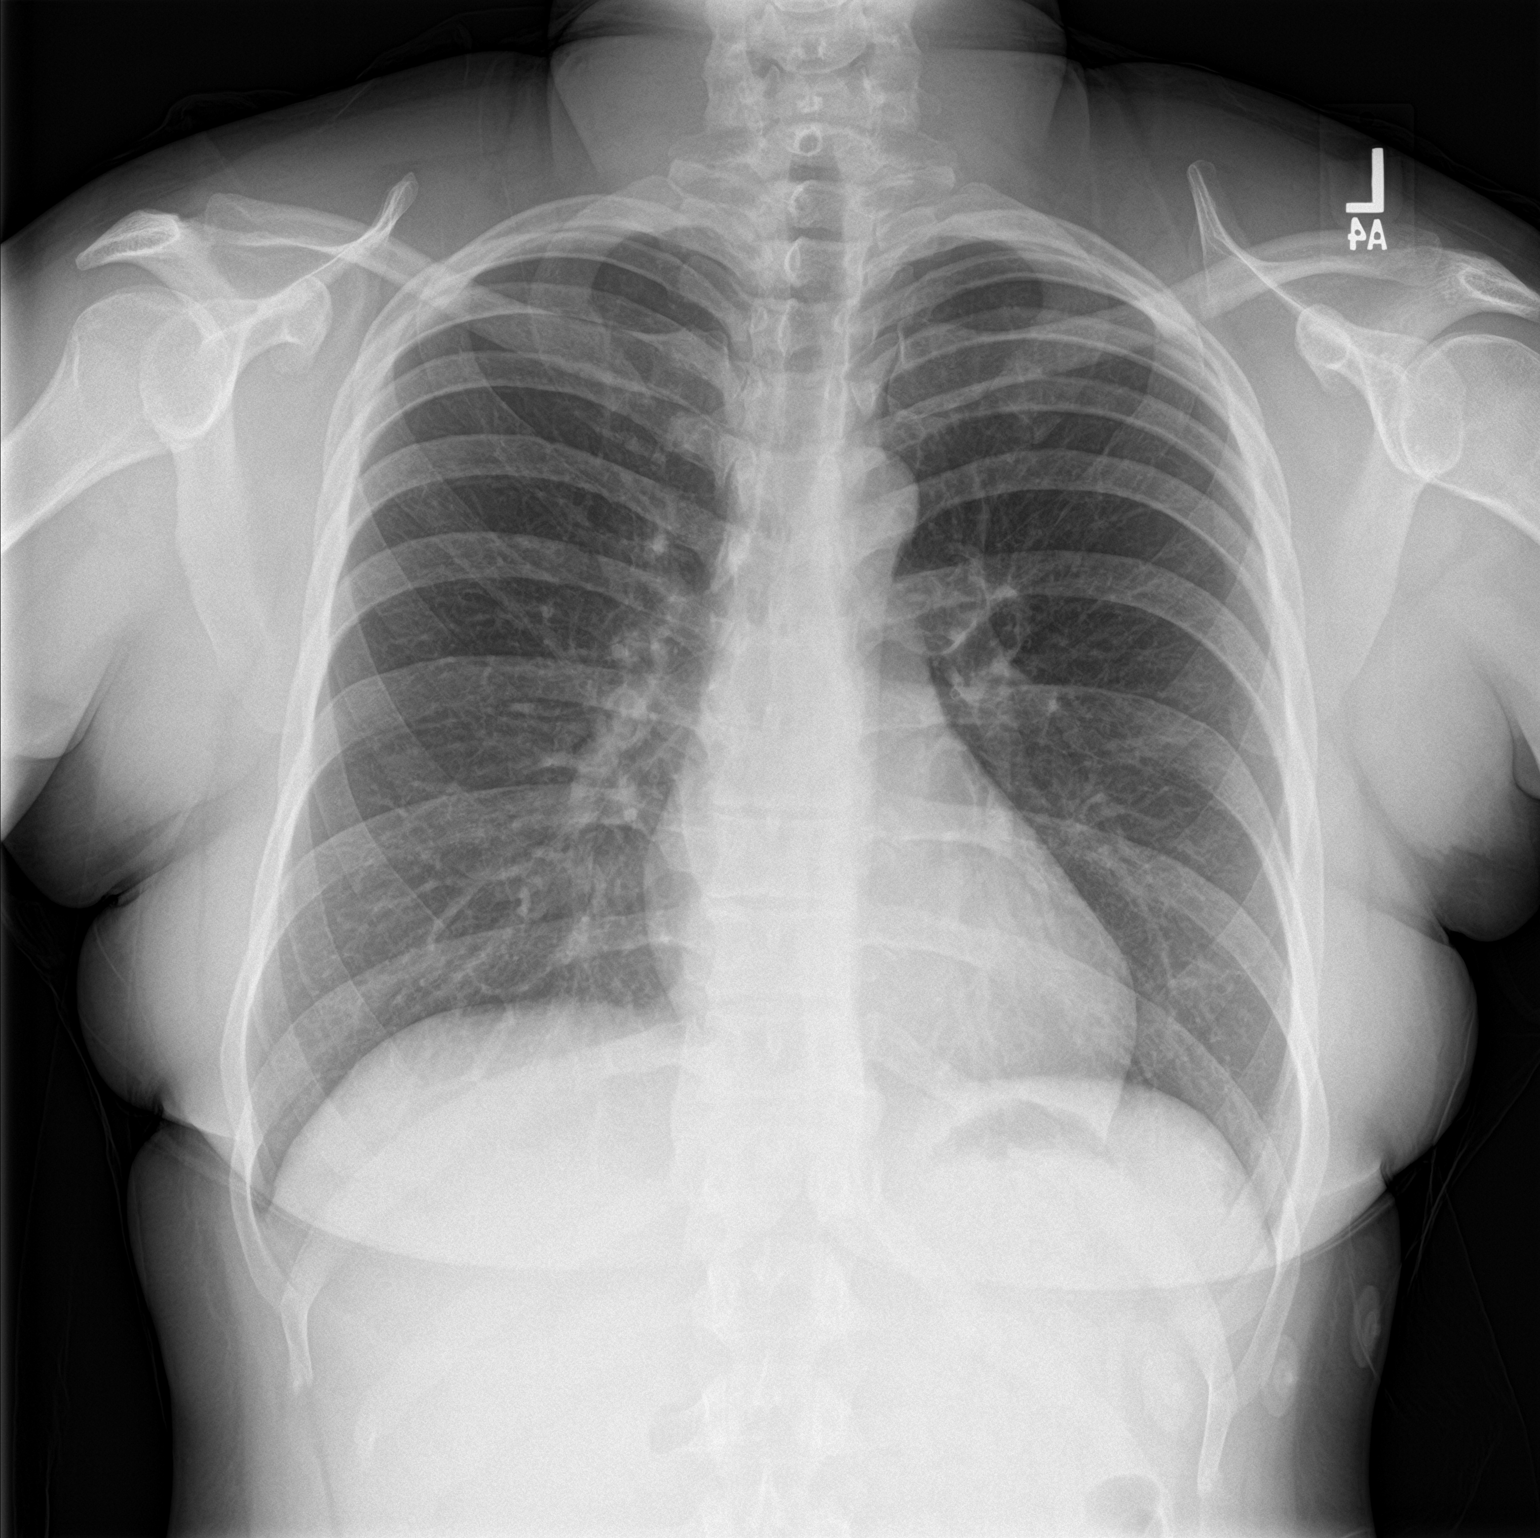

[2 of 2 positions shown; findings below may reference images not displayed]

FINDINGS: The heart size and mediastinal contours are within normal limits.
Both lungs are clear. The visualized skeletal structures are
unremarkable.
IMPRESSION: No active cardiopulmonary disease.

## 2018-05-06 ENCOUNTER — Emergency Department (HOSPITAL_COMMUNITY)
Admission: EM | Admit: 2018-05-06 | Discharge: 2018-05-06 | Disposition: A | Discharge status: Home / Self Care | Payer: Self-pay | Attending: Emergency Medicine | Admitting: Emergency Medicine

## 2018-05-06 ENCOUNTER — Encounter (HOSPITAL_COMMUNITY): Payer: Self-pay | Admitting: Emergency Medicine

## 2018-05-06 ENCOUNTER — Other Ambulatory Visit: Payer: Self-pay

## 2018-05-06 DIAGNOSIS — J029 Acute pharyngitis, unspecified: Secondary | ICD-10-CM | POA: Insufficient documentation

## 2018-05-06 LAB — GROUP A STREP BY PCR: Group A Strep by PCR: NOT DETECTED

## 2018-05-06 MED ORDER — DEXAMETHASONE SODIUM PHOSPHATE 10 MG/ML IJ SOLN
10.0000 mg | Freq: Once | INTRAMUSCULAR | Status: AC
  Administered 2018-05-06: 10 mg via INTRAMUSCULAR
  Filled 2018-05-06: qty 1

## 2018-05-06 MED ORDER — PENICILLIN V POTASSIUM 500 MG PO TABS: 500.0000 mg | ORAL_TABLET | Freq: Three times a day (TID) | ORAL | 0 refills | Status: AC

## 2018-05-06 NOTE — ED Provider Notes (Signed)
[facility] DEPT Provider Note   CSN: [id number] Arrival date & time: 05/06/18  1051    History   Chief Complaint Chief Complaint  Patient presents with  . Sore Throat    HPI Crystal Hatfield is a 38 y.o. female.     HPI  Patient is a 38 year old female with a history of anemia, anxiety, ulcerative colitis presenting for sore throat.  Patient ports she has a sore throat for the past 3 days.  She reports that she had a temperature at home of 100.6.  She denies any significant rhinorrhea, congestion, otalgia, or cough.  She reports that it feels similar to prior episodes of strep pharyngitis.  She has had no recent known exposures to strep pharyngitis.  She denies any difficulty breathing, swallowing, neck swelling or tenderness.  Patient has taken 3 days of antibiotics at home that was left over from a previous doxycycline prescription.  Past Medical History:  Diagnosis Date  . Anemia    borderline per pt - no meds  . Anxiety    no meds  . Arthritis    knees and back, otc meds prn  . Back pain   . Chronic back pain   . Colitis   . Depression    no meds  . Headache(784.0)    history - OTC meds prn - last one 2 weeks    Patient Active Problem List   Diagnosis Date Noted  . Pain in joint, multiple sites 06/17/2012  . Chronic low back pain 05/20/2012  . Ulcerative colitis ([place]) 05/20/2012    Past Surgical History:  Procedure Laterality Date  . arthroscopic surgery     right knee  . COLONOSCOPY     x 3  . LAPAROSCOPY  04/11/2011   Procedure: LAPAROSCOPY OPERATIVE;  Surgeon: Cyril Mourning, MD;  Location: [facility];  Service: Gynecology;  Laterality: N/A;     OB History   No obstetric history on file.      Home Medications    Prior to Admission medications   Medication Sig Start Date End Date Taking? Authorizing Provider  cyclobenzaprine (FLEXERIL) 10 MG tablet Take 1 tablet (10 mg total) by mouth 2 (two) times daily as needed  for muscle spasms. 03/20/18   Robinson, [place] N, PA-C  traMADol (ULTRAM) 50 MG tablet Take 1 tablet (50 mg total) by mouth every 8 (eight) hours as needed. 04/15/18   Kerin Perna, NP    Family History Family History  Problem Relation Age of Onset  . Hypertension Mother   . Mental illness Mother   . Diabetes Maternal Grandmother   . Hypertension Maternal Grandmother   . Heart murmur Sister     Social History Social History   Tobacco Use  . Smoking status: Never Smoker  . Smokeless tobacco: Never Used  Substance Use Topics  . Alcohol use: Yes    Comment: socially  . Drug use: Not Currently    Types: Marijuana    Comment: last use today - 04/07/11     Allergies   Ciprofloxacin; Nsaids; and Sulfa antibiotics   Review of Systems Review of Systems  Constitutional: Positive for chills and fever.  HENT: Positive for sore throat. Negative for drooling, trouble swallowing and voice change.   Respiratory: Negative for stridor.      Physical Exam Updated Vital Signs BP 127/90 (BP Location: Left Arm)   Pulse 95   Temp 99.3 F (37.4 C) (Oral)   Resp 18  Wt 113.4 kg   LMP 04/13/2018 (Exact Date)   SpO2 98%   BMI 40.35 kg/m   Physical Exam Vitals signs and nursing note reviewed.  Constitutional:      General: She is not in acute distress.    Appearance: She is well-developed. She is not diaphoretic.     Comments: Sitting comfortably in bed.  HENT:     Head: Normocephalic and atraumatic.     Mouth/Throat:     Tonsils: Swelling: 2+ on the right. 2+ on the left.     Comments: Normal phonation. No muffled voice sounds. Patient swallows secretions without difficulty. Dentition normal with some gingival erythema. No lesions of tongue or buccal mucosa. Uvula midline. No asymmetric swelling of the posterior pharynx. Erythema of posterior pharynx with tonsillar exudate on right versus tonsil stone. No lingual swelling. No induration inferior to tongue. No submandibular  tenderness, swelling, or induration.  Tissues of the neck supple. No cervical lymphadenopathy. Right TM without erythema or effusion; left TM without erythema or effusion.  Eyes:     General:        Right eye: No discharge.        Left eye: No discharge.     Conjunctiva/sclera: Conjunctivae normal.     Comments: EOMs normal to gross examination.  Neck:     Musculoskeletal: Normal range of motion.  Cardiovascular:     Rate and Rhythm: Normal rate and regular rhythm.     Heart sounds: Normal heart sounds.     Comments: Intact, 2+ radial pulse. Pulmonary:     Effort: Pulmonary effort is normal.     Breath sounds: Normal breath sounds. No wheezing or rales.  Abdominal:     General: There is no distension.  Musculoskeletal: Normal range of motion.  Skin:    General: Skin is warm and dry.  Neurological:     Mental Status: She is alert.     Comments: Cranial nerves intact to gross observation. Patient moves extremities without difficulty.  Psychiatric:        Behavior: Behavior normal.        Thought Content: Thought content normal.        Judgment: Judgment normal.      ED Treatments / Results  Labs (all labs ordered are listed, but only abnormal results are displayed) Labs Reviewed  GROUP A STREP BY PCR    EKG None  Radiology No results found.  Procedures Procedures (including critical care time)  Medications Ordered in ED Medications  dexamethasone (DECADRON) injection 10 mg (has no administration in time range)     Initial Impression / Assessment and Plan / ED Course  I have reviewed the triage vital signs and the nursing notes.  Pertinent labs & imaging results that were available during my care of the patient were reviewed by me and considered in my medical decision making (see chart for details).         Patient nontoxic-appearing, afebrile, in no acute distress peer examination not concerning for RPA or PTA.  Patient's examination is consistent with  strep pharyngitis.  Her rapid strep PCR is negative, however she has been on 3 days of antibiotics that she is taking at home, which may have impacted results.  We will treat empirically.  Return precautions given for any difficulty breathing, swelling, neck induration or submandibular tenderness.  Patient is in understanding and agrees with plan of care.  Final Clinical Impressions(s) / ED Diagnoses   Final diagnoses:  Sore  throat    ED Discharge Orders         Ordered    penicillin v potassium (VEETID) 500 MG tablet  3 times daily     05/06/18 1235           Tamala Julian 05/06/18 1235    Milton Ferguson, MD 05/08/18 1314

## 2018-05-06 NOTE — Discharge Instructions (Signed)
Please read and follow all provided instructions.  Your diagnoses today include:  1. Sore throat     Tests performed today include: Strep test: was negative today, however this could be impacted by recent antibiotic use. Vital signs. See below for your results today.   Medications prescribed:  Please take the penicillin 3 times a day for 10 days.  You were also given a shot of a steroid that will last for 36-72 hours.   Take any medications prescribed only as directed.   Home care instructions:  Please read the educational materials provided and follow any instructions contained in this packet.  Follow-up instructions: Please follow-up with your primary care provider as needed for further evaluation of your symptoms.  Return instructions:  Please return to the Emergency Department if you experience worsening symptoms.  Return if you have worsening problems swallowing, your neck becomes swollen, you cannot swallow your saliva or your voice becomes muffled.  Return with high persistent fever, persistent vomiting, or if you have trouble breathing.  Please return if you have any other emergent concerns.  Additional Information:  Your vital signs today were: BP 127/90 (BP Location: Left Arm)    Pulse 95    Temp 99.3 F (37.4 C) (Oral)    Resp 18    Wt 113.4 kg    LMP 04/13/2018 (Exact Date)    SpO2 98%    BMI 40.35 kg/m  If your blood pressure (BP) was elevated above 135/85 this visit, please have this repeated by your doctor within one month.

## 2018-05-06 NOTE — ED Triage Notes (Signed)
Pt reports 4 day hx of sore throat. Self medicated with 4 dosages of doxycycline and tylenol. Reports low grade fever. Denies exposer to large gathering of people recently. Denies cough.

## 2019-06-07 ENCOUNTER — Other Ambulatory Visit: Payer: Self-pay

## 2019-06-07 ENCOUNTER — Encounter (HOSPITAL_COMMUNITY): Payer: Self-pay | Admitting: *Deleted

## 2019-06-07 DIAGNOSIS — Y999 Unspecified external cause status: Secondary | ICD-10-CM | POA: Diagnosis not present

## 2019-06-07 DIAGNOSIS — Y9389 Activity, other specified: Secondary | ICD-10-CM | POA: Insufficient documentation

## 2019-06-07 DIAGNOSIS — Y92009 Unspecified place in unspecified non-institutional (private) residence as the place of occurrence of the external cause: Secondary | ICD-10-CM | POA: Diagnosis not present

## 2019-06-07 DIAGNOSIS — S199XXA Unspecified injury of neck, initial encounter: Secondary | ICD-10-CM | POA: Diagnosis present

## 2019-06-07 DIAGNOSIS — S161XXA Strain of muscle, fascia and tendon at neck level, initial encounter: Secondary | ICD-10-CM | POA: Diagnosis not present
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
## 2019-06-08 ENCOUNTER — Emergency Department (HOSPITAL_COMMUNITY)
Admission: EM | Admit: 2019-06-08 | Discharge: 2019-06-08 | Disposition: A | Discharge status: Home / Self Care | Payer: BC Managed Care – PPO | Attending: Emergency Medicine | Admitting: Emergency Medicine

## 2019-06-08 ENCOUNTER — Emergency Department (HOSPITAL_COMMUNITY): Payer: BC Managed Care – PPO

## 2019-06-08 DIAGNOSIS — M549 Dorsalgia, unspecified: Secondary | ICD-10-CM

## 2019-06-08 DIAGNOSIS — S161XXA Strain of muscle, fascia and tendon at neck level, initial encounter: Secondary | ICD-10-CM

## 2019-06-08 MED ORDER — CYCLOBENZAPRINE HCL 10 MG PO TABS: 10.0000 mg | ORAL_TABLET | Freq: Two times a day (BID) | ORAL | 0 refills | Status: AC | PRN

## 2019-06-08 MED ORDER — HYDROCODONE-ACETAMINOPHEN 5-325 MG PO TABS
1.0000 | ORAL_TABLET | Freq: Once | ORAL | Status: AC
  Administered 2019-06-08: 1 via ORAL
  Filled 2019-06-08: qty 1

## 2019-06-08 MED ORDER — TRAMADOL HCL 50 MG PO TABS: 50.0000 mg | ORAL_TABLET | Freq: Three times a day (TID) | ORAL | 0 refills | Status: AC | PRN
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
## 2019-08-12 IMAGING — CR DG CHEST 2V
2 series · 2 of 2 positions shown · non-contrast
Comparison: PA and lateral chest x-ray May 07, 2016

CLINICAL DATA: Dull left-sided chest and arm pain at rest which
becomes sharp with movement. Tingling in arms and hand at night
which the patient from sleeping. Nonsmoker.

EXAM:
CHEST - 2 VIEW

[chest pa]
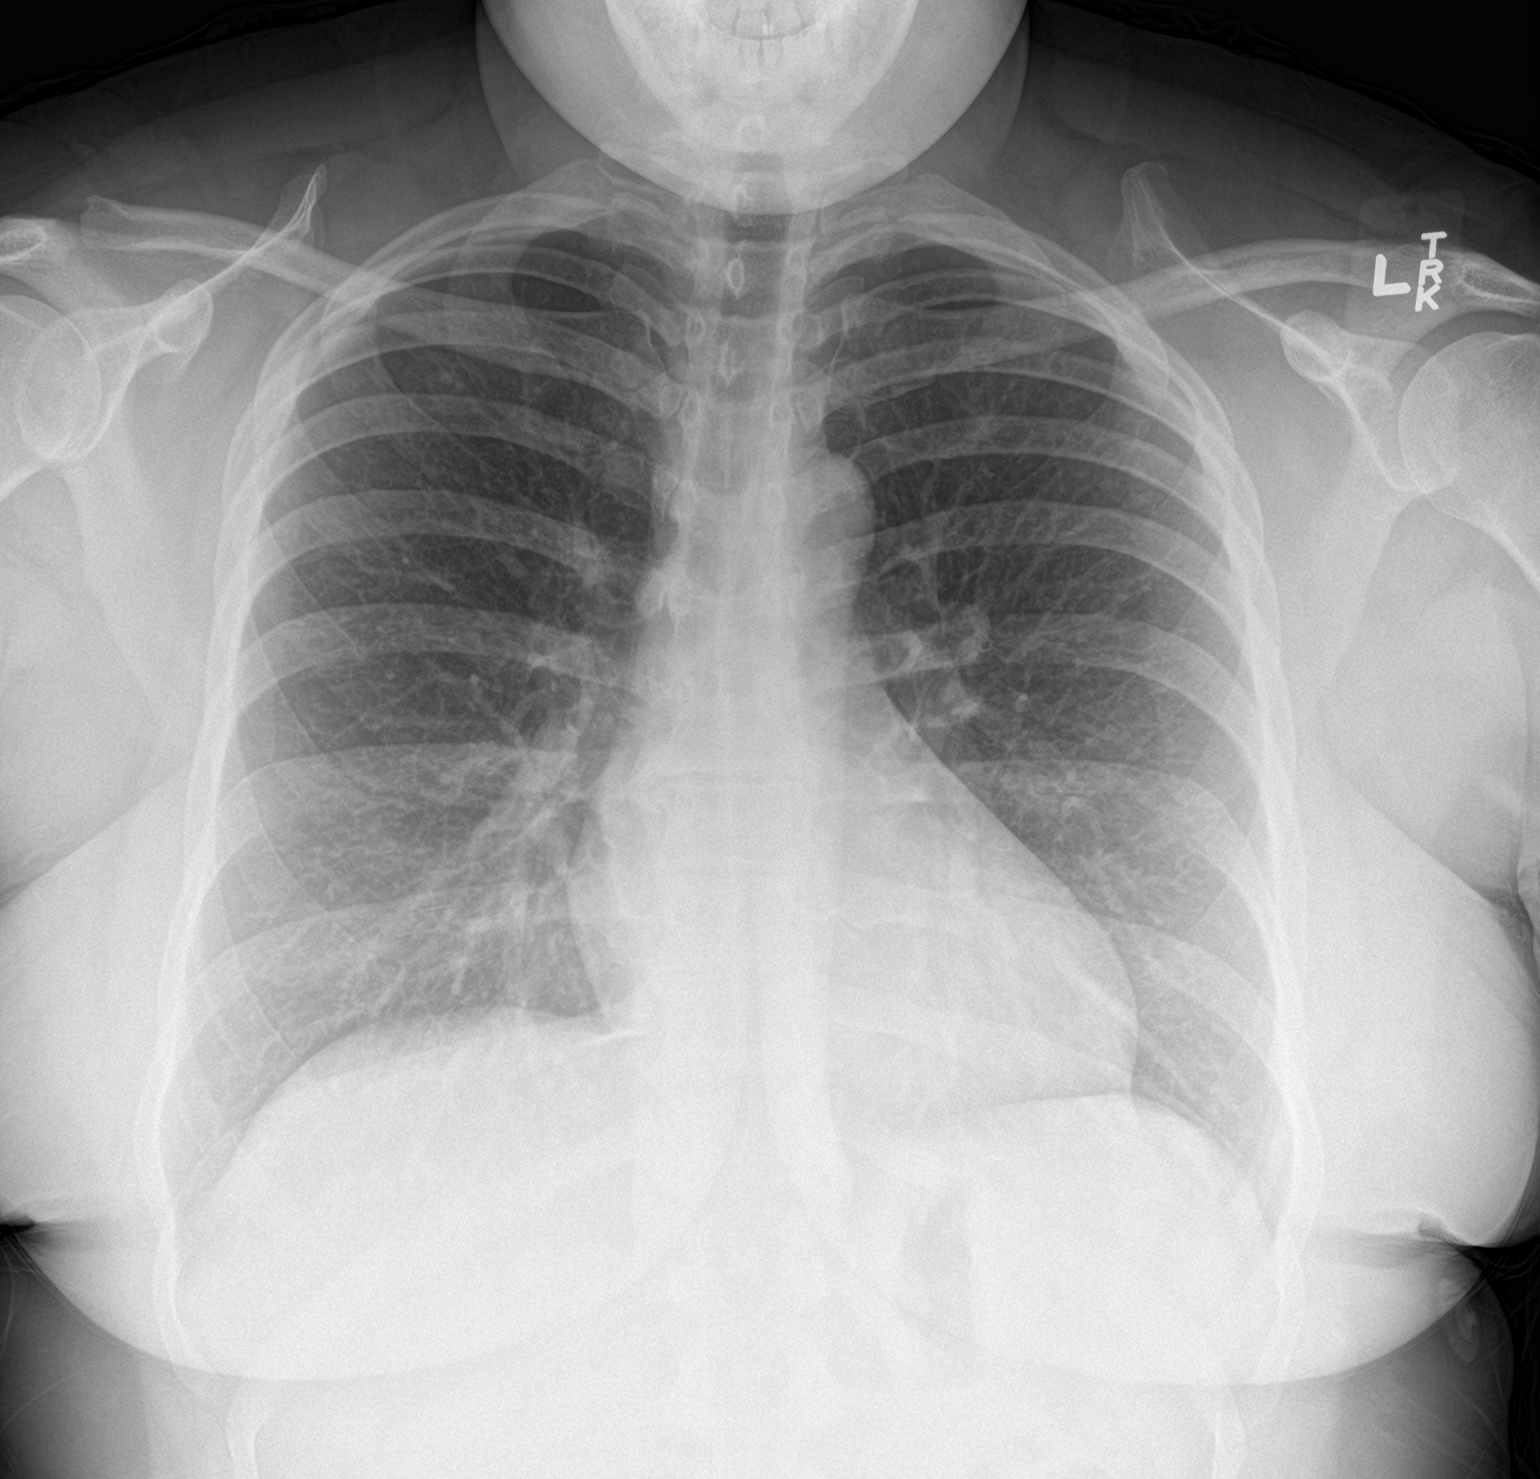

[chest lat]
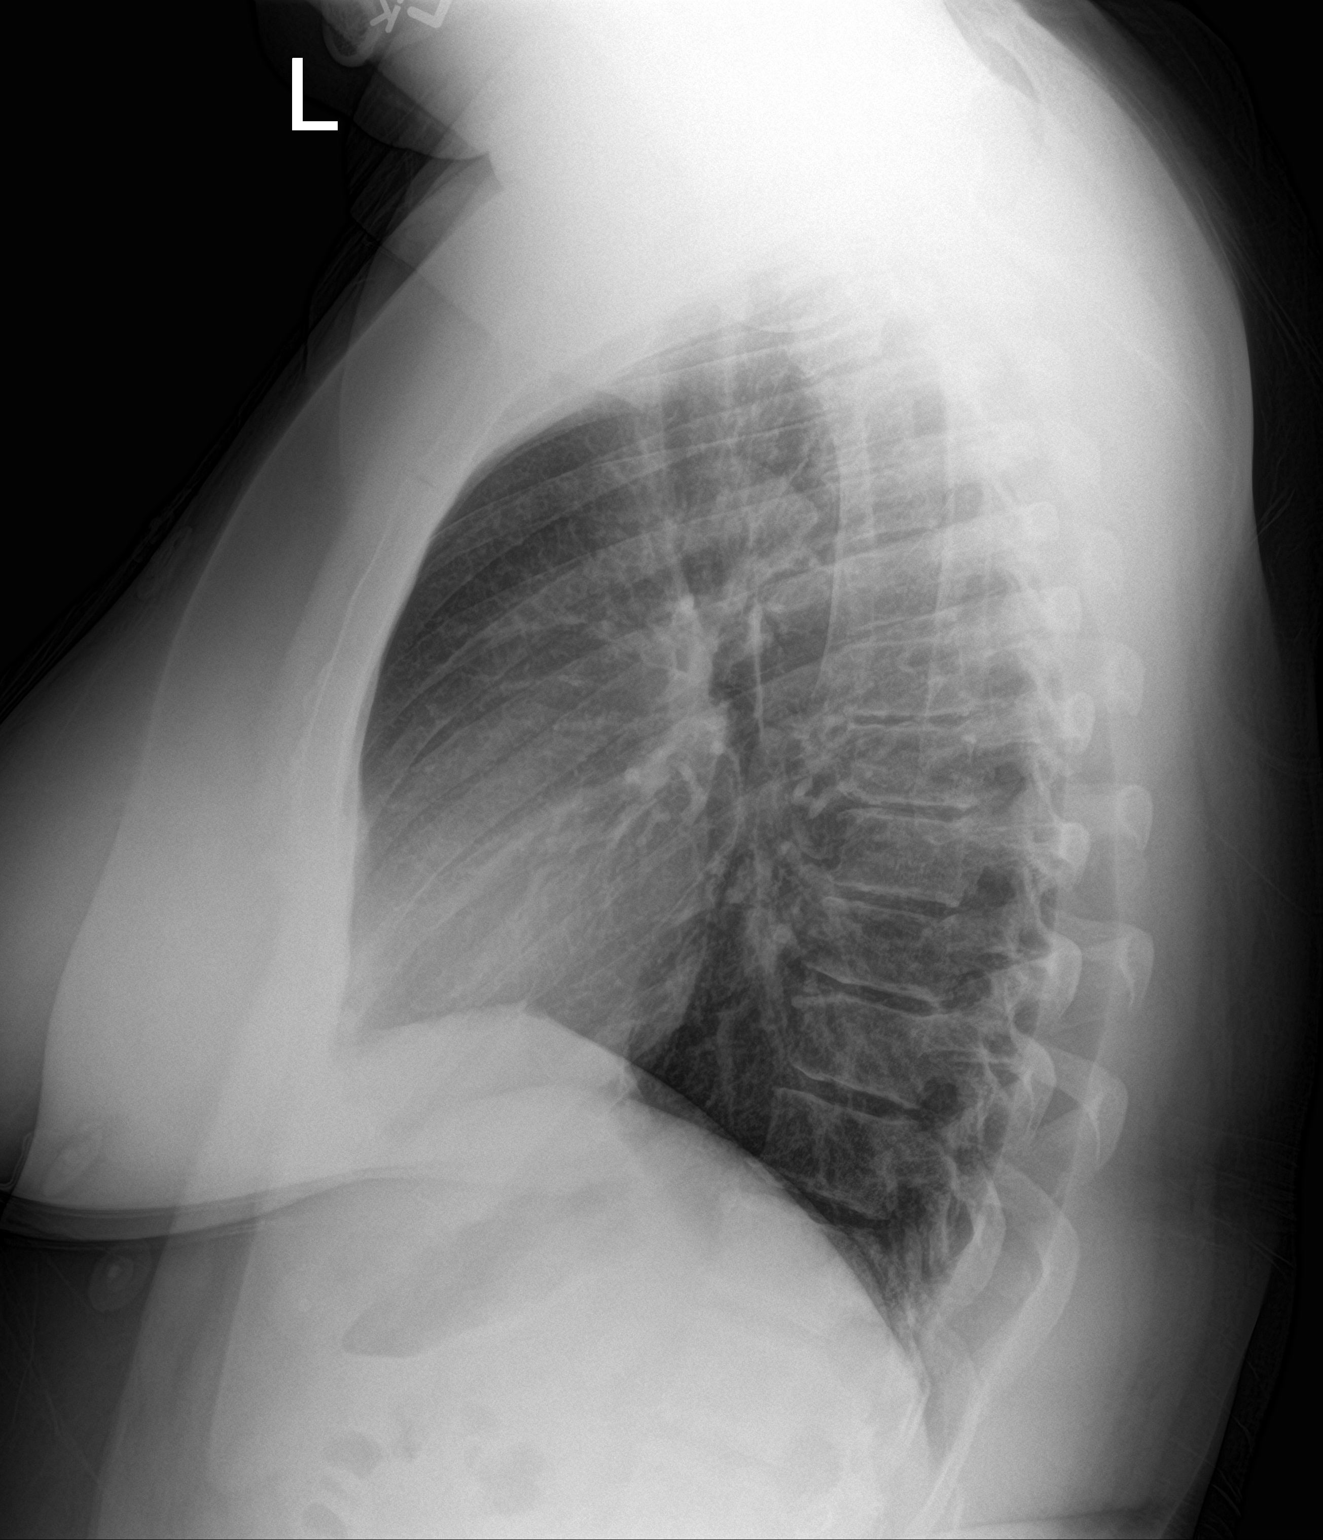

[2 of 2 positions shown; findings below may reference images not displayed]

FINDINGS: The lungs are adequately inflated and clear. The heart and pulmonary
vascularity are normal. The mediastinum is normal in width. There is
no pleural effusion. The bony thorax is unremarkable.
IMPRESSION: There is no pneumonia, CHF, nor other acute cardiopulmonary
abnormality.

## 2021-04-03 ENCOUNTER — Other Ambulatory Visit: Payer: Self-pay | Admitting: Rehabilitation

## 2021-04-03 DIAGNOSIS — M47816 Spondylosis without myelopathy or radiculopathy, lumbar region: Secondary | ICD-10-CM

## 2021-04-27 ENCOUNTER — Other Ambulatory Visit: Payer: Self-pay

## 2021-04-27 ENCOUNTER — Ambulatory Visit
Admission: RE | Admit: 2021-04-27 | Discharge: 2021-04-27 | Disposition: A | Discharge status: Home / Self Care | Payer: BC Managed Care – PPO | Source: Ambulatory Visit | Attending: Rehabilitation | Admitting: Rehabilitation

## 2021-04-27 DIAGNOSIS — M47816 Spondylosis without myelopathy or radiculopathy, lumbar region: Secondary | ICD-10-CM

## 2021-05-11 ENCOUNTER — Encounter (HOSPITAL_COMMUNITY): Payer: Self-pay

## 2021-05-11 ENCOUNTER — Emergency Department (HOSPITAL_COMMUNITY)
Admission: EM | Admit: 2021-05-11 | Discharge: 2021-05-11 | Disposition: A | Discharge status: Home / Self Care | Payer: BC Managed Care – PPO | Attending: Emergency Medicine | Admitting: Emergency Medicine

## 2021-05-11 DIAGNOSIS — K0889 Other specified disorders of teeth and supporting structures: Secondary | ICD-10-CM | POA: Insufficient documentation

## 2021-05-11 MED ORDER — DEXAMETHASONE 4 MG PO TABS
10.0000 mg | ORAL_TABLET | Freq: Once | ORAL | Status: AC
  Administered 2021-05-11: 10 mg via ORAL
  Filled 2021-05-11: qty 1

## 2021-05-11 MED ORDER — OXYCODONE-ACETAMINOPHEN 5-325 MG PO TABS: 1.0000 | ORAL_TABLET | Freq: Four times a day (QID) | ORAL | 0 refills | Status: AC | PRN

## 2021-05-11 NOTE — ED Notes (Signed)
Pt updated on delays ?

## 2021-05-11 NOTE — ED Triage Notes (Signed)
Pt states she had a root canal yesterday on # 4 top right tooth. Pt states that she was seen 2 weeks ago, given abx for swelling in her mouth.  ?Pt states that swelling had increased throughout the day, despite tylenol, PCN, and ice. ?

## 2021-05-11 NOTE — Discharge Instructions (Addendum)
Please use Tylenol for pain.  You may use 1000 mg of Tylenol every 6 hours.  Not to exceed 4 g of Tylenol within 24 hours.   ? ?You can use the narcotic pain medication in place of tylenol as needed for breakthrough pain.  In the meantime I recommend that you apply ice to the affected area, continue taking the antibiotics that you are prescribed, and follow-up closely with your primary care or dentist.  Please return to the emergency department if you have worsening difficulty swallowing, fever, chills or any additional concerns.  It was a pleasure taking care of you today, I hope that you feel better soon. ?

## 2021-05-11 NOTE — ED Provider Notes (Signed)
?[facility] DEPT ?Provider Note ? ? ?CSN: [id number] ?Arrival date & time: 05/11/21  1053 ? ?  ? ?History ? ?Chief Complaint  ?Patient presents with  ? Oral Swelling  ? ? ?Crystal Hatfield is a 41 y.o. female with past medical history significant for previous dental infections, root canal as of yesterday.  Patient is been taking amoxicillin and then penicillin for presumed infection.  She reports that she was only given Tylenol after her root canal yesterday, had pain that kept her up most of the night, but denies fever, chills, difficulty swallowing.  The swelling was worsening this morning she wants to to make sure that she did not have a significant infection.  Has been taking Tylenol for pain with minimal relief. ? ?HPI ? ?  ? ?Home Medications ?Prior to Admission medications   ?Medication Sig Start Date End Date Taking? Authorizing Provider  ?oxyCODONE-acetaminophen (PERCOCET/ROXICET) 5-325 MG tablet Take 1-2 tablets by mouth every 6 (six) hours as needed for severe pain. 05/11/21  Yes Joeann Steppe H, PA-C  ?cyclobenzaprine (FLEXERIL) 10 MG tablet Take 1 tablet (10 mg total) by mouth 2 (two) times daily as needed for muscle spasms. 06/08/19   Molpus, John, MD  ?traMADol (ULTRAM) 50 MG tablet Take 1 tablet (50 mg total) by mouth every 8 (eight) hours as needed (for pain). 06/08/19   Molpus, Jenny Reichmann, MD  ?   ? ?Allergies    ?Ciprofloxacin, Nsaids, and Sulfa antibiotics   ? ?Review of Systems   ?Review of Systems  ?HENT:  Positive for dental problem.   ?All other systems reviewed and are negative. ? ?Physical Exam ?Updated Vital Signs ?BP (!) 132/91   Pulse 72   Temp 98.8 ?F (37.1 ?C) (Oral)   Resp 15   Ht 5' 8"  (1.727 m)   Wt 95.3 kg   LMP 04/20/2021   SpO2 97%   BMI 31.93 kg/m?  ?Physical Exam ?Vitals and nursing note reviewed.  ?Constitutional:   ?   General: She is not in acute distress. ?   Appearance: Normal appearance.  ?HENT:  ?   Head: Normocephalic and atraumatic.   ?   Mouth/Throat:  ?   Comments: Patient with significant swelling of the right buccal mucosa with extension of swelling up to the stigmatic arch on the right.  Internal exam of the mouth reveals overall well-appearing dentition, no evidence of periapical abscess, peritonsillar abscess, swollen tonsils, Ludwig angina or floor of mouth swelling.  Patient without trismus, intact swallow reflex. ?Eyes:  ?   General:     ?   Right eye: No discharge.     ?   Left eye: No discharge.  ?Cardiovascular:  ?   Rate and Rhythm: Normal rate and regular rhythm.  ?Pulmonary:  ?   Effort: Pulmonary effort is normal. No respiratory distress.  ?Musculoskeletal:     ?   General: No deformity.  ?   Cervical back: Neck supple.  ?Lymphadenopathy:  ?   Cervical: No cervical adenopathy.  ?Skin: ?   General: Skin is warm and dry.  ?Neurological:  ?   Mental Status: She is alert and oriented to person, place, and time.  ?Psychiatric:     ?   Mood and Affect: Mood normal.     ?   Behavior: Behavior normal.  ? ? ?ED Results / Procedures / Treatments   ?Labs ?(all labs ordered are listed, but only abnormal results are displayed) ?Labs Reviewed - No data to  display ? ?EKG ?None ? ?Radiology ?No results found. ? ?Procedures ?Procedures  ? ? ?Medications Ordered in ED ?Medications  ?dexamethasone (DECADRON) tablet 10 mg (has no administration in time range)  ? ? ?ED Course/ Medical Decision Making/ A&P ?  ?                        ?Medical Decision Making ?Risk ?Prescription drug management. ? ? ?Is a well-appearing 41 year old female with concern for dental pain, swelling on the right.  She did have a root canal yesterday.  Emergent differential diagnosis includes uncontrolled bleeding, dental abscess, peritonsillar abscess, Ludwig angina, epiglottitis, trismus versus other.  This is not an exhaustive differential. ? ?On physical exam patient does have significant swelling of the right buccal mucosa but no tracking redness, signs of infection, or  abscesses noted.  Patient without trismus, intact swallow.  She is able to tolerate her own secretions and talk without difficulty.  She does not have any cervical adenopathy, neck is supple. ? ?Discussed that she does have a fairly significant amount of swelling, believe is reasonable to try oral dose of steroids x1, and will prescribe stronger pain medication to help patient get some sleep and control pain.  Discussed extensive return precautions and follow-up with dentistry.  Patient understands and agrees to plan, discharged in stable condition at this time. ?Final Clinical Impression(s) / ED Diagnoses ?Final diagnoses:  ?Pain, dental  ? ? ?Rx / DC Orders ?ED Discharge Orders   ? ?      Ordered  ?  oxyCODONE-acetaminophen (PERCOCET/ROXICET) 5-325 MG tablet  Every 6 hours PRN       ? 05/11/21 1241  ? ?  ?  ? ?  ? ? ?  ?Anselmo Pickler, PA-C ?05/11/21 1249 ? ?  ?Daleen Bo, MD ?05/11/21 1620 ? ?

## 2023-05-29 ENCOUNTER — Other Ambulatory Visit: Payer: Self-pay | Admitting: Orthopaedic Surgery

## 2023-05-29 DIAGNOSIS — M4316 Spondylolisthesis, lumbar region: Secondary | ICD-10-CM

## 2023-05-29 DIAGNOSIS — M5416 Radiculopathy, lumbar region: Secondary | ICD-10-CM

## 2023-06-04 ENCOUNTER — Encounter: Payer: Self-pay | Admitting: Orthopaedic Surgery

## 2023-06-06 ENCOUNTER — Ambulatory Visit
Admission: RE | Admit: 2023-06-06 | Discharge: 2023-06-06 | Disposition: A | Discharge status: Home / Self Care | Source: Ambulatory Visit | Attending: Orthopaedic Surgery | Admitting: Orthopaedic Surgery

## 2023-06-06 DIAGNOSIS — M4316 Spondylolisthesis, lumbar region: Secondary | ICD-10-CM

## 2023-06-06 DIAGNOSIS — M5416 Radiculopathy, lumbar region: Secondary | ICD-10-CM

## 2023-08-30 ENCOUNTER — Emergency Department (HOSPITAL_BASED_OUTPATIENT_CLINIC_OR_DEPARTMENT_OTHER)
Admission: EM | Admit: 2023-08-30 | Discharge: 2023-08-30 | Disposition: A | Discharge status: Home / Self Care | Attending: Emergency Medicine | Admitting: Emergency Medicine

## 2023-08-30 ENCOUNTER — Other Ambulatory Visit: Payer: Self-pay

## 2023-08-30 ENCOUNTER — Telehealth (HOSPITAL_BASED_OUTPATIENT_CLINIC_OR_DEPARTMENT_OTHER): Payer: Self-pay | Admitting: Emergency Medicine

## 2023-08-30 ENCOUNTER — Encounter (HOSPITAL_BASED_OUTPATIENT_CLINIC_OR_DEPARTMENT_OTHER): Payer: Self-pay

## 2023-08-30 DIAGNOSIS — R21 Rash and other nonspecific skin eruption: Secondary | ICD-10-CM | POA: Insufficient documentation

## 2023-08-30 MED ORDER — PREDNISONE 10 MG PO TABS: ORAL_TABLET | ORAL | 0 refills | Status: DC

## 2023-08-30 MED ORDER — HYDROCORTISONE 1 % EX CREA: TOPICAL_CREAM | CUTANEOUS | 0 refills | Status: AC

## 2023-08-30 NOTE — ED Triage Notes (Signed)
 Pt reports allergic reaction to face starting x1 week ago. Pt reports being unsure of what they were exposed to. Pt reports starting some new OTC meds - Nad+core liposomal, pure GLP-1, and Nano-micro needle patches

## 2023-08-30 NOTE — Telephone Encounter (Signed)
 Prescription for prednisone  taper sent incorrectly to pharmacy.  I clarified with pharmacy and resent the proper prescription now

## 2023-08-30 NOTE — Discharge Instructions (Addendum)
 Please avoid using these internet medications in the future, as they may have triggered your rash.

## 2023-08-30 NOTE — ED Provider Notes (Signed)
 [place] EMERGENCY DEPARTMENT AT [facility] Provider Note   CSN: [id number] Arrival date & time: 08/30/23  1209     Patient presents with: Allergic Reaction   Crystal Hatfield is a 43 y.o. female presented to ED with concern for rash on the face.  Patient reports that 2 weeks ago she began 2 new medications which she had bought online, which include a Nad+core liposomal agent pill and GLP-1 autoinjector alternative.  She says that 4 days after beginning these new medication she began to develop an itchy and red rash on her face.  She immediately stopped his medications and has not had them in over 1 week.  The rash has slowly gotten worse, extending mostly under her right nose to her right cheek and below her bilateral eyes.  She also has small spots on her neck's.  She denies any tongue lesions, mouth sores, lip blistering, changes in vision, vaginal or inguinal involvement, or axillary involvement.  She denies any difficulty breathing.  She does report a history of self allergies in the past and made her break out in a rash like this all over my body.  Over the past few days she has applied Aquaphor and Benadryl  cream to the rash with no improvement.  She does report that over the past week she did go swimming and feels that the chlorine also irritated her rash   HPI     Prior to Admission medications   Medication Sig Start Date End Date Taking? Authorizing Provider  hydrocortisone  cream 1 % Apply to affected area 2 times daily for 7 days - AVOIDING the eyes, lips and mouth 08/30/23  Yes Charlsey Moragne, Donnice PARAS, MD  predniSONE  (DELTASONE ) 10 MG tablet Take 6 tablets (60 mg total) by mouth daily with breakfast for 3 days, THEN 4 tablets (40 mg total) daily with breakfast for 3 days, THEN 2 tablets (20 mg total) daily with breakfast for 3 days. 08/30/23 09/08/23 Yes Mont Jagoda, Donnice PARAS, MD  cyclobenzaprine  (FLEXERIL ) 10 MG tablet Take 1 tablet (10 mg total) by mouth 2 (two) times daily as  needed for muscle spasms. 06/08/19   Molpus, John, MD  oxyCODONE -acetaminophen  (PERCOCET/ROXICET) 5-325 MG tablet Take 1-2 tablets by mouth every 6 (six) hours as needed for severe pain. 05/11/21   Prosperi, Christian H, PA-C  traMADol  (ULTRAM ) 50 MG tablet Take 1 tablet (50 mg total) by mouth every 8 (eight) hours as needed (for pain). 06/08/19   Molpus, John, MD    Allergies: Ciprofloxacin, Nsaids, and Sulfa antibiotics    Review of Systems  Updated Vital Signs BP (!) 138/96 (BP Location: Right Arm)   Pulse (!) 110   Temp 99.2 F (37.3 C) (Oral)   Resp 17   Ht 5' 5 (1.651 m)   Wt 95.3 kg   SpO2 94%   BMI 34.95 kg/m   Physical Exam Constitutional:      General: She is not in acute distress. HENT:     Head: Normocephalic and atraumatic.  Eyes:     Conjunctiva/sclera: Conjunctivae normal.     Pupils: Pupils are equal, round, and reactive to light.  Cardiovascular:     Rate and Rhythm: Normal rate and regular rhythm.  Pulmonary:     Effort: Pulmonary effort is normal. No respiratory distress.  Skin:    General: Skin is warm and dry.     Comments: Erythematous rash involving predominantly the right side of the face, below the nose, also below and outside the eye,  small satellite lesion on the neck, no disclamation of the skin, no significant blistering; no involvement of the tongue or mouth  Neurological:     General: No focal deficit present.     Mental Status: She is alert. Mental status is at baseline.  Psychiatric:        Mood and Affect: Mood normal.        Behavior: Behavior normal.     (all labs ordered are listed, but only abnormal results are displayed) Labs Reviewed - No data to display  EKG: None  Radiology: No results found.   Procedures   Medications Ordered in the ED - No data to display                                  Medical Decision Making  Patient is here with a rash I strongly suspect is a drug reaction.  She has stopped the offending  agents now.  The rash does not involve the eye, and also spares the mouth and other soft tissue mucosa.  I would have a lower suspicion for Elspeth Louder syndrome at this time, but close return precautions were discussed regarding this condition with the patient.  I think we can try oral prednisone  at this point given the extent of the rash as well as topical hydrocortisone  to apply to the face, sparing the eyes.  Patient verbalized understanding     Final diagnoses:  Rash    ED Discharge Orders          Ordered    predniSONE  (DELTASONE ) 10 MG tablet  Q breakfast        08/30/23 1243    hydrocortisone  cream 1 %        08/30/23 1243               Cottie Donnice PARAS, MD 08/30/23 1243

## 2023-09-01 ENCOUNTER — Telehealth (HOSPITAL_BASED_OUTPATIENT_CLINIC_OR_DEPARTMENT_OTHER): Payer: Self-pay | Admitting: Emergency Medicine

## 2023-09-01 MED ORDER — PREDNISONE 10 MG PO TABS: ORAL_TABLET | ORAL | 0 refills | Status: AC

## 2023-09-01 NOTE — Telephone Encounter (Cosign Needed)
 Patient states pharmacy did not have the full prescription for prednisone .  I re-sent prednisone  prescription to her pharmacy
